# Patient Record
Sex: Male | Born: 1965 | Race: Black or African American | Hispanic: No | Smoking: Former smoker
Health system: Southern US, Community
[De-identification: ages and names within clinical notes are randomized; demographics above are authoritative.]

## PROBLEM LIST (undated history)

## (undated) DIAGNOSIS — I2510 Atherosclerotic heart disease of native coronary artery without angina pectoris: Secondary | ICD-10-CM

## (undated) DIAGNOSIS — M1990 Unspecified osteoarthritis, unspecified site: Secondary | ICD-10-CM

## (undated) DIAGNOSIS — R079 Chest pain, unspecified: Secondary | ICD-10-CM

## (undated) DIAGNOSIS — I10 Essential (primary) hypertension: Secondary | ICD-10-CM

## (undated) DIAGNOSIS — K760 Fatty (change of) liver, not elsewhere classified: Secondary | ICD-10-CM

## (undated) DIAGNOSIS — E785 Hyperlipidemia, unspecified: Secondary | ICD-10-CM

## (undated) HISTORY — PX: CLOSED REDUCTION SHOULDER DISLOCATION: SUR242

## (undated) HISTORY — DX: Hyperlipidemia, unspecified: E78.5

---

## 2013-05-31 ENCOUNTER — Ambulatory Visit (HOSPITAL_COMMUNITY): Admit: 2013-05-31 | Payer: Self-pay | Admitting: Interventional Cardiology

## 2013-05-31 ENCOUNTER — Encounter (HOSPITAL_COMMUNITY): Admission: EM | Disposition: A | Discharge status: Home / Self Care | Payer: Self-pay | Source: Home / Self Care

## 2013-05-31 ENCOUNTER — Encounter (HOSPITAL_COMMUNITY): Payer: Self-pay | Admitting: Cardiology

## 2013-05-31 ENCOUNTER — Observation Stay (HOSPITAL_COMMUNITY)
Admission: EM | Admit: 2013-05-31 | Discharge: 2013-06-02 | DRG: 287 | Disposition: A | Discharge status: Home / Self Care | Payer: BC Managed Care – PPO | Attending: Cardiovascular Disease | Admitting: Cardiovascular Disease

## 2013-05-31 DIAGNOSIS — I213 ST elevation (STEMI) myocardial infarction of unspecified site: Secondary | ICD-10-CM | POA: Insufficient documentation

## 2013-05-31 DIAGNOSIS — E781 Pure hyperglyceridemia: Secondary | ICD-10-CM | POA: Diagnosis present

## 2013-05-31 DIAGNOSIS — R9430 Abnormal result of cardiovascular function study, unspecified: Secondary | ICD-10-CM

## 2013-05-31 DIAGNOSIS — I10 Essential (primary) hypertension: Secondary | ICD-10-CM | POA: Diagnosis present

## 2013-05-31 DIAGNOSIS — R000 Tachycardia, unspecified: Secondary | ICD-10-CM | POA: Diagnosis present

## 2013-05-31 DIAGNOSIS — F1010 Alcohol abuse, uncomplicated: Secondary | ICD-10-CM | POA: Diagnosis present

## 2013-05-31 DIAGNOSIS — Z720 Tobacco use: Secondary | ICD-10-CM

## 2013-05-31 DIAGNOSIS — Z8249 Family history of ischemic heart disease and other diseases of the circulatory system: Secondary | ICD-10-CM | POA: Diagnosis not present

## 2013-05-31 DIAGNOSIS — F17200 Nicotine dependence, unspecified, uncomplicated: Secondary | ICD-10-CM | POA: Diagnosis present

## 2013-05-31 DIAGNOSIS — K759 Inflammatory liver disease, unspecified: Secondary | ICD-10-CM | POA: Diagnosis present

## 2013-05-31 DIAGNOSIS — R079 Chest pain, unspecified: Principal | ICD-10-CM | POA: Diagnosis present

## 2013-05-31 DIAGNOSIS — R7401 Elevation of levels of liver transaminase levels: Secondary | ICD-10-CM | POA: Diagnosis present

## 2013-05-31 DIAGNOSIS — K7689 Other specified diseases of liver: Secondary | ICD-10-CM | POA: Diagnosis present

## 2013-05-31 DIAGNOSIS — R740 Nonspecific elevation of levels of transaminase and lactic acid dehydrogenase [LDH]: Secondary | ICD-10-CM

## 2013-05-31 DIAGNOSIS — K760 Fatty (change of) liver, not elsewhere classified: Secondary | ICD-10-CM | POA: Diagnosis present

## 2013-05-31 HISTORY — DX: Chest pain, unspecified: R07.9

## 2013-05-31 HISTORY — PX: CARDIAC CATHETERIZATION: SHX172

## 2013-05-31 HISTORY — DX: Fatty (change of) liver, not elsewhere classified: K76.0

## 2013-05-31 HISTORY — PX: LEFT HEART CATHETERIZATION WITH CORONARY ANGIOGRAM: SHX5451

## 2013-05-31 HISTORY — DX: Unspecified osteoarthritis, unspecified site: M19.90

## 2013-05-31 HISTORY — DX: Essential (primary) hypertension: I10

## 2013-05-31 LAB — COMPREHENSIVE METABOLIC PANEL
ALT: 284 U/L — AB (ref 0–53)
AST: 652 U/L — AB (ref 0–37)
Albumin: 3.6 g/dL (ref 3.5–5.2)
Alkaline Phosphatase: 157 U/L — ABNORMAL HIGH (ref 39–117)
BILIRUBIN TOTAL: 0.8 mg/dL (ref 0.3–1.2)
BUN: 13 mg/dL (ref 6–23)
CO2: 21 meq/L (ref 19–32)
CREATININE: 0.49 mg/dL — AB (ref 0.50–1.35)
Calcium: 8.6 mg/dL (ref 8.4–10.5)
Chloride: 87 mEq/L — ABNORMAL LOW (ref 96–112)
GFR calc Af Amer: >90 mL/min (ref >90)
GFR calc non Af Amer: >90 mL/min (ref >90)
Glucose, Bld: 125 mg/dL — ABNORMAL HIGH (ref 70–99)
Potassium: 3.6 mEq/L — ABNORMAL LOW (ref 3.7–5.3)
Sodium: 128 mEq/L — ABNORMAL LOW (ref 137–147)
Total Protein: 7 g/dL (ref 6.0–8.3)

## 2013-05-31 LAB — POCT I-STAT, CHEM 8
BUN: 13 mg/dL (ref 6–23)
CREATININE: 0.8 mg/dL (ref 0.50–1.35)
Calcium, Ion: 1.15 mmol/L (ref 1.12–1.23)
Chloride: 96 mEq/L (ref 96–112)
GLUCOSE: 130 mg/dL — AB (ref 70–99)
HCT: 47 % (ref 39.0–52.0)
Hemoglobin: 16 g/dL (ref 13.0–17.0)
POTASSIUM: 3.2 meq/L — AB (ref 3.7–5.3)
SODIUM: 130 meq/L — AB (ref 137–147)
TCO2: 25 mmol/L (ref 0–100)

## 2013-05-31 LAB — CBC
HEMATOCRIT: 39.2 % (ref 39.0–52.0)
Hemoglobin: 14.5 g/dL (ref 13.0–17.0)
MCH: 33.4 pg (ref 26.0–34.0)
MCHC: 37 g/dL — ABNORMAL HIGH (ref 30.0–36.0)
MCV: 90.3 fL (ref 78.0–100.0)
Platelets: 189 10*3/uL (ref 150–400)
RBC: 4.34 MIL/uL (ref 4.22–5.81)
RDW: 14.4 % (ref 11.5–15.5)
WBC: 6.7 10*3/uL (ref 4.0–10.5)

## 2013-05-31 LAB — LIPID PANEL
Cholesterol: 510 mg/dL — ABNORMAL HIGH (ref 0–200)
LDL Cholesterol: UNDETERMINED mg/dL (ref 0–99)
Triglycerides: 3742 mg/dL — ABNORMAL HIGH (ref <150)
VLDL: UNDETERMINED mg/dL (ref 0–40)

## 2013-05-31 LAB — TROPONIN I
Troponin I: <0.3 ng/mL (ref <0.30)
Troponin I: <0.3 ng/mL (ref <0.30)

## 2013-05-31 LAB — PROTIME-INR
INR: 1.1 (ref 0.00–1.49)
PROTHROMBIN TIME: 14 s (ref 11.6–15.2)

## 2013-05-31 LAB — APTT: aPTT: 113 seconds — ABNORMAL HIGH (ref 24–37)

## 2013-05-31 LAB — HEMOGLOBIN A1C
Hgb A1c MFr Bld: 5.8 % — ABNORMAL HIGH (ref <5.7)
Mean Plasma Glucose: 120 mg/dL — ABNORMAL HIGH (ref <117)

## 2013-05-31 LAB — D-DIMER, QUANTITATIVE: D-Dimer, Quant: 0.81 ug/mL-FEU — ABNORMAL HIGH (ref 0.00–0.48)

## 2013-05-31 LAB — CK TOTAL AND CKMB (NOT AT ARMC)
CK, MB: 3.7 ng/mL (ref 0.3–4.0)
RELATIVE INDEX: 2.3 (ref 0.0–2.5)
Total CK: 160 U/L (ref 7–232)

## 2013-05-31 LAB — TSH: TSH: 3.363 u[IU]/mL (ref 0.350–4.500)

## 2013-05-31 LAB — GLUCOSE, CAPILLARY: Glucose-Capillary: 119 mg/dL — ABNORMAL HIGH (ref 70–99)

## 2013-05-31 SURGERY — LEFT HEART CATHETERIZATION WITH CORONARY ANGIOGRAM
Anesthesia: LOCAL

## 2013-05-31 MED ORDER — HEPARIN (PORCINE) IN NACL 2-0.9 UNIT/ML-% IJ SOLN
INTRAMUSCULAR | Status: AC
  Filled 2013-05-31: qty 1000

## 2013-05-31 MED ORDER — ACETAMINOPHEN 325 MG PO TABS: 650.0000 mg | ORAL_TABLET | ORAL | Status: DC | PRN

## 2013-05-31 MED ORDER — METOPROLOL TARTRATE 1 MG/ML IV SOLN
INTRAVENOUS | Status: AC
  Filled 2013-05-31: qty 5

## 2013-05-31 MED ORDER — HEPARIN SODIUM (PORCINE) 1000 UNIT/ML IJ SOLN
INTRAMUSCULAR | Status: AC
  Filled 2013-05-31: qty 1

## 2013-05-31 MED ORDER — MIDAZOLAM HCL 2 MG/2ML IJ SOLN
INTRAMUSCULAR | Status: AC
  Filled 2013-05-31: qty 2

## 2013-05-31 MED ORDER — NITROGLYCERIN 0.2 MG/ML ON CALL CATH LAB
INTRAVENOUS | Status: AC
  Filled 2013-05-31: qty 1

## 2013-05-31 MED ORDER — ALPRAZOLAM 0.25 MG PO TABS: 0.2500 mg | ORAL_TABLET | Freq: Three times a day (TID) | ORAL | Status: DC | PRN

## 2013-05-31 MED ORDER — SODIUM CHLORIDE 0.9 % IV SOLN: INTRAVENOUS | Status: AC

## 2013-05-31 MED ORDER — ASPIRIN 81 MG PO CHEW
81.0000 mg | CHEWABLE_TABLET | Freq: Every day | ORAL | Status: DC
  Administered 2013-06-01 – 2013-06-02 (×2): 81 mg via ORAL
  Filled 2013-05-31 (×2): qty 1

## 2013-05-31 MED ORDER — FENTANYL CITRATE 0.05 MG/ML IJ SOLN
INTRAMUSCULAR | Status: AC
  Filled 2013-05-31: qty 2

## 2013-05-31 MED ORDER — ONDANSETRON HCL 4 MG/2ML IJ SOLN: 4.0000 mg | Freq: Four times a day (QID) | INTRAMUSCULAR | Status: DC | PRN

## 2013-05-31 MED ORDER — ASPIRIN 81 MG PO CHEW: 81.0000 mg | CHEWABLE_TABLET | Freq: Every day | ORAL | Status: DC

## 2013-05-31 MED ORDER — LIDOCAINE HCL (PF) 1 % IJ SOLN
INTRAMUSCULAR | Status: AC
  Filled 2013-05-31: qty 30

## 2013-05-31 MED ORDER — VERAPAMIL HCL 2.5 MG/ML IV SOLN
INTRAVENOUS | Status: AC
  Filled 2013-05-31: qty 2

## 2013-05-31 MED ORDER — METOPROLOL TARTRATE 25 MG PO TABS
25.0000 mg | ORAL_TABLET | Freq: Two times a day (BID) | ORAL | Status: DC
  Administered 2013-05-31 – 2013-06-02 (×5): 25 mg via ORAL
  Filled 2013-05-31 (×6): qty 1

## 2013-05-31 MED ORDER — ZOLPIDEM TARTRATE 5 MG PO TABS: 5.0000 mg | ORAL_TABLET | Freq: Every evening | ORAL | Status: DC | PRN

## 2013-05-31 NOTE — CV Procedure (Signed)
   Cardiac Catheterization Procedure Note  Name: Dylan Summers MRN: [id number] DOB: October 03, 1965  Procedure: Left Heart Cath, Selective Coronary Angiography, LV angiography aortic root angiography,   Indication: Acute dyspnea with palpitations. ECG showed 2 mm of ST elevation in V2 and V3 with Q waves. This was an urgent cardiac catheterization for suspected STEMI.   Medications:  Sedation:  2 mg IV Versed, 50  mcg IV Fentanyl  Contrast:  110 mL  Omnipaque   Procedural Details: The right wrist was prepped, draped, and anesthetized with 1% lidocaine. Using the modified Seldinger technique, a 5 French sheath was introduced into the right radial artery. 3 mg of verapamil was administered through the sheath, weight-based unfractionated heparin was administered intravenously. standard Judkins  catheters was used for selective coronary angiography. A pigtail catheter was used for left ventriculography. Catheter exchanges were performed over an exchange length guidewire. There were no immediate procedural complications. A TR band was used for radial hemostasis at the completion of the procedure.  The patient was transferred to the post catheterization recovery area for further monitoring.  Procedural Findings:  Hemodynamics: AO:  130/97   mmHg LV:  134/9    mmHg LVEDP:  10   mmHg  Coronary angiography: Coronary dominance: Right   the coronary arteries are overall tortuous suggestive of hypertensive changes.    Left Main:  Short and normal.   Left Anterior Descending (LAD):  Normal in size with no significant disease.   1st diagonal (D1):  Medium in size and normal   2nd diagonal (D2):  Normal   3rd diagonal (D3):  Small in size with no significant disease   Circumflex (LCx):  Large in diameter and nondominant. The vessel has no significant disease.   1st obtuse marginal:  Small in size with no significant disease.   2nd obtuse marginal:  Normal in size with no significant  disease.   3rd obtuse marginal:  Normal in size with no significant disease.    Right Coronary Artery: Normal in size and dominant. The vessel has minor irregularities with no significant disease.   Posterior descending artery: Normal in size with no significant disease.   Left ventriculography: Left ventricular systolic function is normal , LVEF is estimated at 55-60 %, there is no  significant mitral regurgitation   Aortic root angiography was performed in the LAO position: This showed normal diameter of the ascending aorta, aortic arch and the proximal portion of the descending aorta. No evidence of aneurysm or dissection  Final Conclusions:   1. Normal coronary arteries   2. Normal LV systolic function and left ventricular end-diastolic pressure. 3. No evidence of thoracic aortic aneurysm or dissection.  Recommendations:  The exact etiology of the patient's symptoms is still not entirely clear. Observe overnight. Check d-dimer. If elevated evaluate for pulmonary embolism. Obtain an echocardiogram to evaluate for hypertensive heart disease. Possible discharge in the morning.   [name] MD, [facility] 05/31/2013, 12:18 PM

## 2013-05-31 NOTE — Progress Notes (Signed)
Utilization Review Completed Shamarr Faucett J. Wheeler [employer], RN, BSN, NCM [phone]  

## 2013-05-31 NOTE — Progress Notes (Signed)
TR BAND REMOVAL  LOCATION:    right radial  DEFLATED PER PROTOCOL:    yes  TIME BAND OFF / DRESSING APPLIED:    1415   SITE UPON ARRIVAL:    Level 0  SITE AFTER BAND REMOVAL:    Level 0  REVERSE ALLEN'S TEST:     positive  CIRCULATION SENSATION AND MOVEMENT:    Within Normal Limits   yes  COMMENTS:   TOLERATED PROCEDURE WELL

## 2013-05-31 NOTE — H&P (Signed)
Patient ID: Dylan Summers MRN: [id number], DOB/AGE: 1965/11/21   Admit date: 05/31/2013   Primary Physician: No primary provider on file. Primary Cardiologist: Dr Kirke Corin  HPI: 48 y/o with a history of HTN admitted via EMS with STEMI with chest discomfort, palpitations, and ST elevation in V2 and V3.  Pt stable on arrival to ER, taken directly to the cath lab after being evaluated by Dr Kirke Corin.    Problem List: Past Medical History  Diagnosis Date  . HTN (hypertension)     No past surgical history on file.   Allergies: Allergies not on file   Home Medications No current facility-administered medications for this encounter.     Family History  Problem Relation Age of Onset  . Coronary artery disease Mother     CABG     History   Social History  . Marital Status: Married    Spouse Name: N/A    Number of Children: N/A  . Years of Education: N/A   Occupational History  . Not on file.   Social History Main Topics  . Smoking status: Light Tobacco Smoker    Types: Cigars  . Smokeless tobacco: Not on file  . Alcohol Use: Not on file  . Drug Use: Not on file  . Sexual Activity: Not on file   Other Topics Concern  . Not on file   Social History Narrative   Married, one child, works at [employer]     Review of Systems: General: negative for chills, fever, night sweats or weight changes.  Cardiovascular: negative for chest pain, dyspnea on exertion, edema, orthopnea, palpitations, paroxysmal nocturnal dyspnea or shortness of breath Dermatological: negative for rash Respiratory: negative for cough or wheezing Urologic: negative for hematuria Abdominal: negative for nausea, vomiting, diarrhea, bright red blood per rectum, melena, or hematemesis Neurologic: negative for visual changes, syncope, or dizziness All other systems reviewed and are otherwise negative except as noted above.  Physical Exam: There were no vitals taken for this visit.  General appearance:  alert, cooperative and no distress Neck: no carotid bruit and no JVD Lungs: clear to auscultation bilaterally Heart: regular rate and rhythm and tachycardic Abdomen: not distended, non tender Extremities: no edema Pulses: 2+ and symmetric Skin: Skin color, texture, turgor normal. No rashes or lesions Neurologic: Grossly normal    Labs:  No results found for this or any previous visit (from the past 24 hour(s)).   Radiology/Studies: No results found.  EKG: NSR, ST J point elevation V2 and V3  ASSESSMENT AND PLAN:  Principal Problem:   STEMI (ST elevation myocardial infarction) 05/31/13 Active Problems:   HTN (hypertension)   Family history of coronary artery bypass surgery   PLAN: Urgent cath.    Deland Pretty, PA-C 05/31/2013, 11:43 AM

## 2013-05-31 NOTE — H&P (Signed)
   I have seen and examined the patient. I agree with the above note with the addition of : 48 year old male with PMH of hypertension and tobacco use. He presented with dyspnea, nausea and palpitations which started around 5:30 am. The wife reports that he recently complained of recent fatigue and not feeling well. There was a possible syncopal episode about 2 months ago while in the mall with his daughters. He did not seek medical attention.  ECG on presentation showed septal Q waves with 2 mm STE in V2 and V3. Previous ECG showed minimal anterior STE.  I proceeded with urgent cardiac cath which showed no significant CAD. Normal EF and no evidence of aortic dissection.  Recommend overnight observation . Check D-dimer and echocardiogram. Check telemetry given reported palpitation and questionable syncopal episode.   Possible discharge tomorrow if negative work up.   [name] MD, [facility] 05/31/2013 1:33 PM

## 2013-06-01 ENCOUNTER — Inpatient Hospital Stay (HOSPITAL_COMMUNITY): Payer: BC Managed Care – PPO

## 2013-06-01 ENCOUNTER — Encounter (HOSPITAL_COMMUNITY): Payer: Self-pay | Admitting: Nurse Practitioner

## 2013-06-01 DIAGNOSIS — I517 Cardiomegaly: Secondary | ICD-10-CM

## 2013-06-01 DIAGNOSIS — R7401 Elevation of levels of liver transaminase levels: Secondary | ICD-10-CM | POA: Diagnosis present

## 2013-06-01 DIAGNOSIS — Z720 Tobacco use: Secondary | ICD-10-CM | POA: Diagnosis present

## 2013-06-01 DIAGNOSIS — R740 Nonspecific elevation of levels of transaminase and lactic acid dehydrogenase [LDH]: Secondary | ICD-10-CM

## 2013-06-01 LAB — BASIC METABOLIC PANEL
BUN: 7 mg/dL (ref 6–23)
CHLORIDE: 86 meq/L — AB (ref 96–112)
CO2: 21 mEq/L (ref 19–32)
Calcium: 9.1 mg/dL (ref 8.4–10.5)
Creatinine, Ser: 0.59 mg/dL (ref 0.50–1.35)
GFR calc Af Amer: >90 mL/min (ref >90)
GFR calc non Af Amer: >90 mL/min (ref >90)
GLUCOSE: 99 mg/dL (ref 70–99)
POTASSIUM: 5.4 meq/L — AB (ref 3.7–5.3)
Sodium: 126 mEq/L — ABNORMAL LOW (ref 137–147)

## 2013-06-01 LAB — TROPONIN I: Troponin I: <0.3 ng/mL (ref <0.30)

## 2013-06-01 MED ORDER — POTASSIUM CHLORIDE ER 20 MEQ PO TBCR: 20.0000 meq | EXTENDED_RELEASE_TABLET | Freq: Every day | ORAL | Status: DC

## 2013-06-01 MED ORDER — HYDRALAZINE HCL 20 MG/ML IJ SOLN: 10.0000 mg | Freq: Once | INTRAMUSCULAR | Status: DC

## 2013-06-01 MED ORDER — HYDRALAZINE HCL 10 MG PO TABS
10.0000 mg | ORAL_TABLET | Freq: Once | ORAL | Status: AC
  Administered 2013-06-01: 10 mg via ORAL
  Filled 2013-06-01: qty 1

## 2013-06-01 MED ORDER — IOHEXOL 350 MG/ML SOLN
100.0000 mL | Freq: Once | INTRAVENOUS | Status: AC | PRN
  Administered 2013-06-01: 100 mL via INTRAVENOUS

## 2013-06-01 NOTE — Progress Notes (Addendum)
    Subjective:  No chest pain or dyspnea. Still with some palpitations.  Objective:  Vital Signs in the last 24 hours: Temp:  [97.9 F (36.6 C)-98.7 F (37.1 C)] 97.9 F (36.6 C) (02/05 0741) Pulse Rate:  [74-112] 78 (02/05 0741) Resp:  [16-20] 20 (02/05 0741) BP: (143-168)/(91-107) 158/95 mmHg (02/05 0741) SpO2:  [94 %-99 %] 94 % (02/05 0741) Weight:  [171 lb 4.8 oz (77.7 kg)] 171 lb 4.8 oz (77.7 kg) (02/05 0019)  Intake/Output from previous day: 02/04 0701 - 02/05 0700 In: 1071.7 [P.O.:480; I.V.:591.7] Out: 700 [Urine:700]  Physical Exam: Pt is alert and oriented, NAD HEENT: normal Neck: JVP - normal, carotids 2+= without bruits Lungs: CTA bilaterally CV: RRR without murmur or gallop Abd: soft, NT, Positive BS, no hepatomegaly Ext: no C/C/E, distal pulses intact and equal Skin: warm/dry no rash   Lab Results:  Recent Labs  05/31/13 1150 05/31/13 1153  WBC 6.7  --   HGB 14.5 16.0  PLT 189  --     Recent Labs  05/31/13 1150 05/31/13 1153  NA 128* 130*  K 3.6* 3.2*  CL 87* 96  CO2 21  --   GLUCOSE 125* 130*  BUN 13 13  CREATININE 0.49* 0.80    Recent Labs  05/31/13 1955 06/01/13 0109  TROPONINI <0.30 <0.30    Cardiac Studies: Cath reviewed.  Echo: Study Conclusions  Left ventricle: The cavity size was normal. There was mild focal basal hypertrophy of the septum. Systolic function was normal. The estimated ejection fraction was in the range of 55% to 60%. Wall motion was normal; there were no regional wall motion abnormalities. Doppler parameters are consistent with abnormal left ventricular relaxation (grade 1 diastolic dysfunction).   Tele: Sinus rhythm, sinus tach up to 150 bpm  Assessment/Plan:  1. Chest pain - cath unremarkable, normal LV function, no pericardial effusion.  2. Tachycardia, unexplained. Elevated D-dimer - recommend Chest CTA to eval for pulmonary embolus.  Dispo pending CTA result. If negative I think we have  ruled out all serious causes of chest pain/tachycardia. Data reviewed including labs, cath, and echo.   Tonny Bollman, M.D. 06/01/2013, 9:58 AM   LFT's reviewed. Transaminases markedly elevated. Pt without abdominal pain or GI symptoms. Recommend keep overnight, check abdominal ultrasound, repeat LFT's and lipid panel in the am. Will ask GI to see after initial evaluation done. Explained plan to patient and wife who understand and agree with plan. He may have alcoholic hepatitis as he admits to heavy Etoh.  Tonny Bollman 06/01/2013 3:41 PM

## 2013-06-01 NOTE — Discharge Summary (Signed)
Discharge Summary   Patient ID: Dylan Summers,  MRN: [id number], DOB/AGE: Apr 22, 1966 48 y.o.  Admit date: 05/31/2013 Discharge date: 06/02/2013  [facility] Primary Cardiologist: Judie PetitM. Kirke CorinArida, MD   Discharge Diagnoses Principal Problem:   Chest pain  **Status post catheterization this admission revealing normal coronary arteries.  **Status post CT angiography of the chest with contrast revealing no evidence of pulmonary embolism.  Active Problems:   HTN (hypertension)   Tobacco abuse Steatosis, liver  Allergies No Known Allergies  Procedures  Cardiac Catheterization 2.4.2015  Hemodynamics: AO:  130/97   mmHg LV:  134/9    mmHg LVEDP:           10   mmHg  Coronary angiography: Coronary dominance: Right   the coronary arteries are overall tortuous suggestive of hypertensive changes.       Left Main:  Short and normal.    Left Anterior Descending (LAD):  Normal in size with no significant disease.    1st diagonal (D1):  Medium in size and normal    2nd diagonal (D2):  Normal    3rd diagonal (D3):  Small in size with no significant disease    Circumflex (LCx):  Large in diameter and nondominant. The vessel has no significant disease.    1st obtuse marginal:  Small in size with no significant disease.    2nd obtuse marginal:  Normal in size with no significant disease.    3rd obtuse marginal:  Normal in size with no significant disease.             Right Coronary Artery: Normal in size and dominant. The vessel has minor irregularities with no significant disease.    Posterior descending artery: Normal in size with no significant disease.  Left ventriculography: Left ventricular systolic function is normal , LVEF is estimated at 55-60 %, there is no  significant mitral regurgitation   Aortic root angiography was performed in the LAO position: This showed normal diameter of the ascending aorta, aortic arch and the proximal portion of the  descending aorta. No evidence of aneurysm or dissection  Final Conclusions:   1. Normal coronary arteries    2. Normal LV systolic function and left ventricular end-diastolic pressure. 3. No evidence of thoracic aortic aneurysm or dissection. _____________   2D Echocardiogram 2.5.2015  Study Conclusions  Left ventricle: The cavity size was normal. There was mild focal basal hypertrophy of the septum. Systolic function was normal. The estimated ejection fraction was in the range of 55% to 60%. Wall motion was normal; there were no regional wall motion abnormalities. Doppler parameters are consistent with abnormal left ventricular relaxation (grade 1 diastolic dysfunction).   _____________   CT Angio of the Chest with Contrast  2.5.2015  IMPRESSION: No pulmonary embolus. No acute abnormality identified within the chest. _____________   ABD ultrosound 06/01/12 Fatty infiltration liver. The study is otherwise negative    History of Present Illness  48 year old male with prior history of hypertension, tobacco abuse, and alcohol use. He has no prior history of coronary artery disease. He was in his usual state of health until the morning of admission when he developed substernal chest discomfort associated with palpitations. ECG showed very slight ST segment elevation in lead V2 and V3 and a code STEMI was activated. The patient was taken to the cardiac catheterization laboratory for emergent catheterization.  Hospital Course  Diagnostic catheterization was performed and showed normal coronary arteries. He also had normal LV function. He  was transferred to the step down area where he subsequently ruled out for myocardial infarction and had no further chest pain. His d-dimer was mildly elevated at 0.81 and a CT angiography of the chest with contrast was performed and did not show any evidence of pulmonary embolism. 2-D echocardiography was also performed and showed normal LV function  with grade 1 diastolic dysfunction and without any significant valvular abnormalities. Patient has been ambulating without recurrent symptoms or limitations. Of note, he has been found to have elevated transaminases as well as a total cholesterol 510 and triglycerides of 3742. His lipid profile was not fasting however.  Discharge was thus delayed and follow up TG 704 T CHOL 377.   Abd ultrasound was done revealing fatty live.  GI consult was obtained with Dr. Dulce Sellar who felt Hepatocellular-predominant. 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 However, given his elevated lipid profile, could have component of non-alcohol steatohepatitis (NASH) as well.  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  They will follow post hospital.  We did add Lovaza to medications.  He will follow up with Dr. Kirke Corin and arrange for repeat Lipids or lipid clinic.    We will check amylase or lipase prior to discharge for possible pancreatitis.  They were mildly elevated and I reviewed with Dr. Mayford Knife and Dr. Dulce Sellar.  No change in treatment plan and no abdominal pain.   Pt was seen and evaluated by Dr. Mayford Knife and found stable for discharge.  His labs had improved prior to discharge.  For his HTN we increased his BB.  He will follow up as instructed.  He will need lipids and hepatic in 6 weeks though GI may eval. Prior to that time.   Discharge Vitals Blood pressure 153/102, pulse 88, temperature 98.4 F (36.9 C), temperature source Oral, resp. rate 18, height 5\' 7"  (1.702 m), weight 172 lb 11.3 oz (78.34 kg), SpO2 99.00%.  Filed Weights   06/01/13 0019 06/01/13 1742  Weight: 171 lb 4.8 oz (77.7 kg) 172 lb 11.3 oz (78.34 kg)    Labs   CBC  Recent Labs  05/31/13 1150 05/31/13 1153  WBC 6.7  --   HGB 14.5 16.0  HCT 39.2 47.0  MCV 90.3  --   PLT 189  --    Basic Metabolic Panel  Recent  Labs  71/21/97 06/01/13 1330  NA 130* 126*  K 3.9 5.4*  CL 87* 86*  CO2 26 21  GLUCOSE 108* 99  BUN 8 7  CREATININE 0.61 0.59  CALCIUM 9.9 9.1   Liver Function Tests  Recent Labs  06/01/13 06/02/13 1030  AST 469* 295*  ALT 364* 289*  ALKPHOS 145* 128*  BILITOT 2.0* 1.6*  PROT 8.7* 7.6  ALBUMIN 4.3 3.7   Cardiac Enzymes  Recent Labs  05/31/13 1150 05/31/13 1455 05/31/13 1955 06/01/13 0109  CKTOTAL 160  --   --   --   CKMB 3.7  --   --   --   TROPONINI <0.30 <0.30 <0.30 <0.30   D-Dimer  Recent Labs  05/31/13 1455  DDIMER 0.81*   Hemoglobin A1C  Recent Labs  05/31/13 1150  HGBA1C 5.8*   Fasting Lipid Panel  Recent Labs  06/01/13 2345  CHOL 377*  HDL 38*  LDLCALC UNABLE TO CALCULATE IF TRIGLYCERIDE OVER 400 mg/dL  TRIG 588*  CHOLHDL 9.9   Thyroid Function Tests  Recent  Labs  05/31/13 1455  TSH 3.363     Amylase 143 Lipase 64   Disposition  Pt is being discharged home today in good condition.  Follow-up Plans & Appointments      Follow-up Information   Follow up with [facility], MD. (1-2 wks)    Specialty:  [facility]   Contact information:   [address] [place] [address] [phone]       Follow up with Freddy Jaksch, MD On 06/23/2013. (at 3:00 PM)    Specialty:  Gastroenterology   Contact information:   [address], [address] [place] [address] [phone]       Follow up with [name], MD On 06/20/2013. (at 8:45 AM  for follow up -lipids)    Specialty:  Cardiology   Contact information:   [facility]. [address] [place] [address] [phone]      Discharge Medications    Medication List         lisinopril-hydrochlorothiazide 20-25 MG per tablet  Commonly known as:  PRINZIDE,ZESTORETIC  Take 1 tablet by mouth daily.     metoprolol 50 MG tablet  Commonly known as:  LOPRESSOR  Take 1 tablet (50 mg total) by mouth 2 (two) times daily.      omega-3 acid ethyl esters 1 G capsule  Commonly known as:  LOVAZA  Take 1 capsule (1 g total) by mouth 2 (two) times daily.     Potassium Chloride ER 20 MEQ Tbcr  Take 20 mEq by mouth daily.       Outstanding Labs/Studies  None  Duration of Discharge Encounter   Greater than 30 minutes including physician time.  Signed, Nada Boozer R NP 06/02/2013, 3:13 PM

## 2013-06-01 NOTE — Progress Notes (Signed)
Assessed for IV start, unable to find one.  Will page IV nurse. Dylan Summers

## 2013-06-01 NOTE — Progress Notes (Signed)
MD returned paged, ordered 10mg  of hydralazine. Dylan Summers

## 2013-06-01 NOTE — Progress Notes (Signed)
Pt. BP is 157/115.  MD paged.  Awaiting call back.  Will continue to monitor. Vanice Sarah

## 2013-06-01 NOTE — Progress Notes (Signed)
Notified pharmacy for hydralazine Vanice Sarah

## 2013-06-01 NOTE — Progress Notes (Signed)
  Echocardiogram 2D Echocardiogram has been performed.  Cathie Beams 06/01/2013, 8:49 AM

## 2013-06-02 ENCOUNTER — Encounter (HOSPITAL_COMMUNITY): Payer: Self-pay | Admitting: Cardiology

## 2013-06-02 DIAGNOSIS — K760 Fatty (change of) liver, not elsewhere classified: Secondary | ICD-10-CM

## 2013-06-02 DIAGNOSIS — R740 Nonspecific elevation of levels of transaminase and lactic acid dehydrogenase [LDH]: Secondary | ICD-10-CM

## 2013-06-02 DIAGNOSIS — I219 Acute myocardial infarction, unspecified: Secondary | ICD-10-CM

## 2013-06-02 DIAGNOSIS — F17200 Nicotine dependence, unspecified, uncomplicated: Secondary | ICD-10-CM

## 2013-06-02 DIAGNOSIS — R7402 Elevation of levels of lactic acid dehydrogenase (LDH): Secondary | ICD-10-CM

## 2013-06-02 DIAGNOSIS — Z8249 Family history of ischemic heart disease and other diseases of the circulatory system: Secondary | ICD-10-CM

## 2013-06-02 HISTORY — DX: Fatty (change of) liver, not elsewhere classified: K76.0

## 2013-06-02 LAB — COMPREHENSIVE METABOLIC PANEL
ALBUMIN: 4.3 g/dL (ref 3.5–5.2)
ALT: 364 U/L — AB (ref 0–53)
AST: 469 U/L — AB (ref 0–37)
Alkaline Phosphatase: 145 U/L — ABNORMAL HIGH (ref 39–117)
BUN: 8 mg/dL (ref 6–23)
CALCIUM: 9.9 mg/dL (ref 8.4–10.5)
CO2: 26 mEq/L (ref 19–32)
Chloride: 87 mEq/L — ABNORMAL LOW (ref 96–112)
Creatinine, Ser: 0.61 mg/dL (ref 0.50–1.35)
GFR calc non Af Amer: >90 mL/min (ref >90)
Glucose, Bld: 108 mg/dL — ABNORMAL HIGH (ref 70–99)
Potassium: 3.9 mEq/L (ref 3.7–5.3)
SODIUM: 130 meq/L — AB (ref 137–147)
TOTAL PROTEIN: 8.7 g/dL — AB (ref 6.0–8.3)
Total Bilirubin: 2 mg/dL — ABNORMAL HIGH (ref 0.3–1.2)

## 2013-06-02 LAB — HEPATIC FUNCTION PANEL
ALT: 289 U/L — AB (ref 0–53)
AST: 295 U/L — AB (ref 0–37)
Albumin: 3.7 g/dL (ref 3.5–5.2)
Alkaline Phosphatase: 128 U/L — ABNORMAL HIGH (ref 39–117)
BILIRUBIN DIRECT: 0.6 mg/dL — AB (ref 0.0–0.3)
BILIRUBIN TOTAL: 1.6 mg/dL — AB (ref 0.3–1.2)
Indirect Bilirubin: 1 mg/dL — ABNORMAL HIGH (ref 0.3–0.9)
Total Protein: 7.6 g/dL (ref 6.0–8.3)

## 2013-06-02 LAB — AMYLASE: Amylase: 143 U/L — ABNORMAL HIGH (ref 0–105)

## 2013-06-02 LAB — LIPID PANEL
CHOL/HDL RATIO: 9.9 ratio
Cholesterol: 377 mg/dL — ABNORMAL HIGH (ref 0–200)
HDL: 38 mg/dL — AB (ref >39)
LDL CALC: UNDETERMINED mg/dL (ref 0–99)
Triglycerides: 704 mg/dL — ABNORMAL HIGH (ref <150)
VLDL: UNDETERMINED mg/dL (ref 0–40)

## 2013-06-02 LAB — LIPASE, BLOOD: Lipase: 64 U/L — ABNORMAL HIGH (ref 11–59)

## 2013-06-02 MED ORDER — OMEGA-3-ACID ETHYL ESTERS 1 G PO CAPS: 1.0000 g | ORAL_CAPSULE | Freq: Two times a day (BID) | ORAL | Status: DC

## 2013-06-02 MED ORDER — OMEGA-3-ACID ETHYL ESTERS 1 G PO CAPS
1.0000 g | ORAL_CAPSULE | Freq: Two times a day (BID) | ORAL | Status: DC
  Administered 2013-06-02: 1 g via ORAL
  Filled 2013-06-02: qty 1

## 2013-06-02 MED ORDER — METOPROLOL TARTRATE 50 MG PO TABS: 50.0000 mg | ORAL_TABLET | Freq: Two times a day (BID) | ORAL | Status: DC

## 2013-06-02 NOTE — Consult Note (Signed)
[facility] Consultation Note  Referring Provider:  Dr. [name] (Cardiology) Primary Care Physician:  Almedia Balls, MD  Reason for Consultation:  Elevated LFTs  HPI: Dylan Summers is a 48 y.o. male admitted for chest pain.   We were asked to see for evaluation of elevated LFTs.  Patient drinks 2-3 alcoholic beverages near-daily, sometimes more sometimes less.  No prior history of liver troubles.  LFTs as below.  No family history of liver disease.  He has no nausea, vomiting, dysphagia, hematemesis, melena, hematochezia, abdominal pain, change in bowel habits.  Is losing some weight, intentionally, through dietary modification and exercise.  No fevers, jaundice, pruritus.  No recent new medications.  Had abdominal ultrasound this admission showing steatosis, no biliary dilatation, no focal liver mass.   Past Medical History  Diagnosis Date  . HTN (hypertension)   . Arthritis     "left groin & right foot pains only when weather gets cold" (05/31/2013)  . Chest pain     a. 05/2013 Ant STEMI called but cath showed nl cors and EF 55-60%, Trop neg, D-dimer elevated->CTA    Past Surgical History  Procedure Laterality Date  . Cardiac catheterization  05/31/2013    Prior to Admission medications   Medication Sig Start Date End Date Taking? Authorizing Provider  lisinopril-hydrochlorothiazide (PRINZIDE,ZESTORETIC) 20-25 MG per tablet Take 1 tablet by mouth daily.   Yes Historical Provider, MD  potassium chloride 20 MEQ TBCR Take 20 mEq by mouth daily. 06/01/13   Ok Anis, NP    Current Facility-Administered Medications  Medication Dose Route Frequency Provider Last Rate Last Dose  . acetaminophen (TYLENOL) tablet 650 mg  650 mg Oral Q4H PRN Abelino Derrick, PA-C      . ALPRAZolam Prudy Feeler) tablet 0.25 mg  0.25 mg Oral TID PRN Abelino Derrick, PA-C      . aspirin chewable tablet 81 mg  81 mg Oral Daily Abelino Derrick, PA-C   81 mg at 06/02/13 0981  . metoprolol (LOPRESSOR)  tablet 50 mg  50 mg Oral BID Quintella Reichert, MD      . zolpidem (AMBIEN) tablet 5 mg  5 mg Oral QHS PRN Abelino Derrick, PA-C        Allergies as of 05/31/2013  . (No Known Allergies)    Family History  Problem Relation Age of Onset  . Coronary artery disease Mother     CABG    History   Social History  . Marital Status: Married    Spouse Name: N/A    Number of Children: N/A  . Years of Education: N/A   Occupational History  . Not on file.   Social History Main Topics  . Smoking status: Light Tobacco Smoker    Types: Cigars  . Smokeless tobacco: Never Used  . Alcohol Use: No  . Drug Use: No  . Sexual Activity: Yes     Comment: 05/31/2013 "might smoke a Black N Mild once/wk"   Other Topics Concern  . Not on file   Social History Narrative   Married, one child, works at [employer]    Review of Systems: ROS Orthoptist (PA-C for Dr. Kirke Corin) reviewed and I agree  Physical Exam: Vital signs in last 24 hours: Temp:  [97.8 F (36.6 C)-98.7 F (37.1 C)] 98.4 F (36.9 C) (02/06 0513) Pulse Rate:  [83-96] 96 (02/06 0933) Resp:  [18] 18 (02/06 0513) BP: (141-157)/(88-115) 148/88 mmHg (02/06 0933) SpO2:  [98 %-99 %]  99 % (02/06 0513) Weight:  [78.34 kg (172 lb 11.3 oz)] 78.34 kg (172 lb 11.3 oz) (02/05 1742) Last BM Date: 06/01/13 General:   Alert,  Well-developed, well-nourished, pleasant and cooperative in NAD Head:  Normocephalic and atraumatic. Eyes:  Sclera clear, no icterus.   Conjunctiva pink. Ears:  Normal auditory acuity. Nose:  No deformity, discharge,  or lesions. Mouth:  No deformity or lesions.  Oropharynx pink & moist. Neck:  Supple; no masses or thyromegaly. Lungs:  Clear throughout to auscultation.   No wheezes, crackles, or rhonchi. No acute distress. Heart:  Regular rate and rhythm; no murmurs, clicks, rubs,  or gallops. Abdomen:  Soft, nontender and nondistended. No masses, hepatosplenomegaly or hernias noted. Normal bowel sounds, without guarding, and  without rebound.     Msk:  Symmetrical without gross deformities. Normal posture. Pulses:  Normal pulses noted. Extremities:  Without clubbing or edema. Neurologic:  Alert and  oriented x4;  grossly normal neurologically. Skin:  Intact without significant lesions or rashes. Cervical Nodes:  No significant cervical adenopathy. Psych:  Alert and cooperative. Normal mood and affect.   Lab Results:  Recent Labs  05/31/13 1150 05/31/13 1153  WBC 6.7  --   HGB 14.5 16.0  HCT 39.2 47.0  PLT 189  --    BMET  Recent Labs  05/31/13 1150 05/31/13 1153 06/01/13 06/01/13 1330  NA 128* 130* 130* 126*  K 3.6* 3.2* 3.9 5.4*  CL 87* 96 87* 86*  CO2 21  --  26 21  GLUCOSE 125* 130* 108* 99  BUN 13 13 8 7   CREATININE 0.49* 0.80 0.61 0.59  CALCIUM 8.6  --  9.9 9.1   LFT  Recent Labs  06/02/13 1030  PROT 7.6  ALBUMIN 3.7  AST 295*  ALT 289*  ALKPHOS 128*  BILITOT 1.6*  BILIDIR 0.6*  IBILI 1.0*   PT/INR  Recent Labs  05/31/13 1150  LABPROT 14.0  INR 1.10    Studies/Results: Ct Angio Chest Pe W/cm &/or Wo Cm  06/01/2013   CLINICAL DATA:  Chest pain, tachycardia  EXAM: CT ANGIOGRAPHY CHEST WITH CONTRAST  TECHNIQUE: Multidetector CT imaging of the chest was performed using the standard protocol during bolus administration of intravenous contrast. Multiplanar CT image reconstructions including MIPs were obtained to evaluate the vascular anatomy.  CONTRAST:  OMNIPAQUE IOHEXOL 350 MG/ML SOLN  COMPARISON:  None.  FINDINGS: There is no pulmonary embolus. The aorta is normal. There is no mediastinal or hilar lymphadenopathy. The heart size is normal. There is no pericardial effusion. Images of the lungs demonstrate minor bilateral apical scar. There is no pulmonary infiltrate or pleural effusion. Images of the visualized upper abdominal structures demonstrate diffuse fatty infiltration of the liver. The other visualized upper abdominal structures are unremarkable. No acute  abnormalities identified within the visualized bones.  Review of the MIP images confirms the above findings.  IMPRESSION: No pulmonary embolus. No acute abnormality identified within the chest.   Electronically Signed   By: Sherian Rein M.D.   On: 06/01/2013 11:38   US Abdomen Complete  06/01/2013   CLINICAL DATA:  Abnormal liver function tests.  EXAM: ULTRASOUND ABDOMEN COMPLETE  COMPARISON:  None.  FINDINGS: Gallbladder:  No gallstones or wall thickening visualized. No sonographic Murphy sign noted.  Common bile duct:  Diameter: 0.4 cm.  Liver:  No focal liver lesion or intrahepatic biliary ductal dilatation is identified. Increased echogenicity is compatible fatty infiltration.  IVC:  No abnormality visualized.  Pancreas:  Visualized portion unremarkable.  Spleen:  Size and appearance within normal limits.  Right Kidney:  Length: 11.0 cm. Echogenicity within normal limits. No mass or hydronephrosis visualized.  Left Kidney:  Length: 11.9 cm. Echogenicity within normal limits. No hydronephrosis or stone. 1.2 cm simple lower pole cyst noted.  Abdominal aorta:  No aneurysm visualized.  Other findings:  None.  IMPRESSION: Fatty infiltration liver.  The study is otherwise negative   Electronically Signed   By: Drusilla Kannerhomas  Dalessio M.D.   On: 06/01/2013 23:38   Impression:  1.  Elevated LFTs.  Hepatocellular-predominant.  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  However, given his elevated lipid profile, could have component of non-alcohol steatohepatitis (NASH) as well. 2.  Abnormal imaging study of liver, steatosis seen.  Differential considerations as above.  Plan:  1.  Strongly counseled patient to abstain completely from alcohol use. 2.  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 3.  OK to start whatever antilipid therapy is felt  indicated. 4.  [facility] [phone]) in 3-4 weeks; we will recheck LFTs at that time, and, if LFTs are still elevated, will initiate further abnormal LFT serologic work-up (iron profile, Hep A/B/C, autoimmune serologies). 5.  Will sign-off; please call with questions; thank you for the consult.   LOS: 2 days   Taelar Gronewold M  06/02/2013, 12:05 PM

## 2013-06-02 NOTE — Discharge Instructions (Signed)
**  PLEASE REMEMBER TO BRING ALL OF YOUR MEDICATIONS TO EACH OF YOUR FOLLOW-UP OFFICE VISITS.  Radial Site Care Refer to this sheet in the next few weeks. These instructions provide you with information on caring for yourself after your procedure. Your caregiver may also give you more specific instructions. Your treatment has been planned according to current medical practices, but problems sometimes occur. Call your caregiver if you have any problems or questions after your procedure. HOME CARE INSTRUCTIONS  You may shower the day after the procedure.Remove the bandage (dressing) and gently wash the site with plain soap and water.Gently pat the site dry.   Do not apply powder or lotion to the site.   Do not submerge the affected site in water for 3 to 5 days.   Inspect the site at least twice daily.   Do not flex or bend the affected arm for 24 hours.   No lifting over 5 pounds (2.3 kg) for 5 days after your procedure.   Do not drive home if you are discharged the same day of the procedure. Have someone else drive you.   You may drive 24 hours after the procedure unless otherwise instructed by your caregiver.  What to expect:  Any bruising will usually fade within 1 to 2 weeks.   Blood that collects in the tissue (hematoma) may be painful to the touch. It should usually decrease in size and tenderness within 1 to 2 weeks.  SEEK IMMEDIATE MEDICAL CARE IF:  You have unusual pain at the radial site.   You have redness, warmth, swelling, or pain at the radial site.   You have drainage (other than a small amount of blood on the dressing).   You have chills.   You have a fever or persistent symptoms for more than 72 hours.   You have a fever and your symptoms suddenly get worse.   Your arm becomes pale, cool, tingly, or numb.   You have heavy bleeding from the site. Hold pressure on the site.   Stopping alcohol would improve your liver.  Decrease fatty foods and sweet foods.   Decrease and stop tobacco.    Follow up as scheduled.

## 2013-06-02 NOTE — Progress Notes (Signed)
NP gave ok to discharge pt.  Discharge instructions given and explained to pt.  Pt. Verbalized understanding of all instructions and had no questions.  IV removed previously by NT.  Pt. Discharged to home with wife and children. Vanice Sarah

## 2013-06-02 NOTE — Progress Notes (Addendum)
SUBJECTIVE:  No complaints of chest pain or SOB  OBJECTIVE:   Vitals:   Filed Vitals:   06/01/13 2332 06/01/13 2333 06/02/13 0513 06/02/13 0933  BP: 149/96 149/96 141/97 148/88  Pulse:   90 96  Temp:   98.4 F (36.9 C)   TempSrc:   Oral   Resp:   18   Height:      Weight:      SpO2:   99%      PHYSICAL EXAM General: Well developed, well nourished, in no acute distress Head: Eyes PERRLA, No xanthomas.   Normal cephalic and atramatic  Lungs:   Clear bilaterally to auscultation and percussion. Heart:   HRRR S1 S2 Pulses are 2+ & equal. Abdomen: Bowel sounds are positive, abdomen soft and non-tender without masses Extremities:   No clubbing, cyanosis or edema.  DP +1 Neuro: Alert and oriented X 3. Psych:  Good affect, responds appropriately   LABS: Basic Metabolic Panel:  Recent Labs  78/29/5600/09/08 06/01/13 1330  NA 130* 126*  K 3.9 5.4*  CL 87* 86*  CO2 26 21  GLUCOSE 108* 99  BUN 8 7  CREATININE 0.61 0.59  CALCIUM 9.9 9.1   Liver Function Tests:  Recent Labs  05/31/13 1150 06/01/13  AST 652* 469*  ALT 284* 364*  ALKPHOS 157* 145*  BILITOT 0.8 2.0*  PROT 7.0 8.7*  ALBUMIN 3.6 4.3   No results found for this basename: LIPASE, AMYLASE,  in the last 72 hours CBC:  Recent Labs  05/31/13 1150 05/31/13 1153  WBC 6.7  --   HGB 14.5 16.0  HCT 39.2 47.0  MCV 90.3  --   PLT 189  --    Cardiac Enzymes:  Recent Labs  05/31/13 1150 05/31/13 1455 05/31/13 1955 06/01/13 0109  CKTOTAL 160  --   --   --   CKMB 3.7  --   --   --   TROPONINI <0.30 <0.30 <0.30 <0.30   BNP: No components found with this basename: POCBNP,  D-Dimer:  Recent Labs  05/31/13 1455  DDIMER 0.81*   Hemoglobin A1C:  Recent Labs  05/31/13 1150  HGBA1C 5.8*   Fasting Lipid Panel:  Recent Labs  06/01/13 2345  CHOL 377*  HDL 38*  LDLCALC UNABLE TO CALCULATE IF TRIGLYCERIDE OVER 400 mg/dL  TRIG 213704*  CHOLHDL 9.9   Thyroid Function Tests:  Recent Labs   05/31/13 1455  TSH 3.363   Anemia Panel: No results found for this basename: VITAMINB12, FOLATE, FERRITIN, TIBC, IRON, RETICCTPCT,  in the last 72 hours Coag Panel:   Lab Results  Component Value Date   INR 1.10 05/31/2013    RADIOLOGY: Ct Angio Chest Pe W/cm &/or Wo Cm  06/01/2013   CLINICAL DATA:  Chest pain, tachycardia  EXAM: CT ANGIOGRAPHY CHEST WITH CONTRAST  TECHNIQUE: Multidetector CT imaging of the chest was performed using the standard protocol during bolus administration of intravenous contrast. Multiplanar CT image reconstructions including MIPs were obtained to evaluate the vascular anatomy.  CONTRAST:  100mL OMNIPAQUE IOHEXOL 350 MG/ML SOLN  COMPARISON:  None.  FINDINGS: There is no pulmonary embolus. The aorta is normal. There is no mediastinal or hilar lymphadenopathy. The heart size is normal. There is no pericardial effusion. Images of the lungs demonstrate minor bilateral apical scar. There is no pulmonary infiltrate or pleural effusion. Images of the visualized upper abdominal structures demonstrate diffuse fatty infiltration of the liver. The other visualized upper abdominal structures are unremarkable. No  acute abnormalities identified within the visualized bones.  Review of the MIP images confirms the above findings.  IMPRESSION: No pulmonary embolus. No acute abnormality identified within the chest.   Electronically Signed   By: Sherian Rein M.D.   On: 06/01/2013 11:38   US Abdomen Complete  06/01/2013   CLINICAL DATA:  Abnormal liver function tests.  EXAM: ULTRASOUND ABDOMEN COMPLETE  COMPARISON:  None.  FINDINGS: Gallbladder:  No gallstones or wall thickening visualized. No sonographic Murphy sign noted.  Common bile duct:  Diameter: 0.4 cm.  Liver:  No focal liver lesion or intrahepatic biliary ductal dilatation is identified. Increased echogenicity is compatible fatty infiltration.  IVC:  No abnormality visualized.  Pancreas:  Visualized portion unremarkable.  Spleen:  Size  and appearance within normal limits.  Right Kidney:  Length: 11.0 cm. Echogenicity within normal limits. No mass or hydronephrosis visualized.  Left Kidney:  Length: 11.9 cm. Echogenicity within normal limits. No hydronephrosis or stone. 1.2 cm simple lower pole cyst noted.  Abdominal aorta:  No aneurysm visualized.  Other findings:  None.  IMPRESSION: Fatty infiltration liver.  The study is otherwise negative   Electronically Signed   By: Drusilla Kanner M.D.   On: 06/01/2013 23:38   Assessment/Plan:  1. Chest pain - cath unremarkable, normal LV function, no pericardial effusion.  2. Tachycardia, unexplained. Elevated D-dimer - Chest  CTA results with no PE and no pathology in the chest.  I think we have ruled out all serious causes of chest pain/tachycardia. Data reviewed including labs, cath, and echo.  3.  LFT's reviewed. Transaminases markedly elevated. Pt without abdominal pain or GI symptoms.  Abdominal ultrasound with no acute abnormality except fatty liver. Repeat LFT's this am. Will ask GI to see. He may have alcoholic hepatitis as he admits to heavy Etoh.  4.  Lipids markedly abnormal.  His Total cholesterol is 377 with triglycerides of 704 and HDL 38.  Could not calculate LDL due to high trigylerides. Await input from GI if safe to start cholesterol lowering drugs.  Would probably start with Lovaza to get triglycerides down +/- fenofibrate if ok with GI  5.  Discharge later today to home if ok with GI 5.  HTN still with elevated BP - will increase Metoprolol to 50mg  BID for better HR and BP control       Quintella Reichert, MD  06/02/2013  10:11 AM

## 2013-06-02 NOTE — Progress Notes (Signed)
[facility] notified of consult.

## 2013-06-20 ENCOUNTER — Ambulatory Visit (INDEPENDENT_AMBULATORY_CARE_PROVIDER_SITE_OTHER): Payer: BC Managed Care – PPO | Admitting: Cardiovascular Disease

## 2013-06-20 ENCOUNTER — Encounter: Payer: Self-pay | Admitting: Cardiovascular Disease

## 2013-06-20 VITALS — BP 150/84 | HR 71 | Wt 175.0 lb

## 2013-06-20 DIAGNOSIS — K7689 Other specified diseases of liver: Secondary | ICD-10-CM

## 2013-06-20 DIAGNOSIS — I10 Essential (primary) hypertension: Secondary | ICD-10-CM

## 2013-06-20 DIAGNOSIS — K760 Fatty (change of) liver, not elsewhere classified: Secondary | ICD-10-CM

## 2013-06-20 DIAGNOSIS — R079 Chest pain, unspecified: Secondary | ICD-10-CM

## 2013-06-20 DIAGNOSIS — E785 Hyperlipidemia, unspecified: Secondary | ICD-10-CM

## 2013-06-20 MED ORDER — FISH OIL 1200 MG PO CAPS: 3.0000 | ORAL_CAPSULE | Freq: Every day | ORAL | Status: DC

## 2013-06-20 MED ORDER — CARVEDILOL 12.5 MG PO TABS: 12.5000 mg | ORAL_TABLET | Freq: Two times a day (BID) | ORAL | Status: DC

## 2013-06-20 NOTE — Assessment & Plan Note (Signed)
He has a followup appointment with Dr. Dulce Sellar.

## 2013-06-20 NOTE — Assessment & Plan Note (Addendum)
He could not obtain Lovaza which was not covered by his insurance. I instructed him to take 3 capsules of over-the-counter fish oil (1200 mg). I suspect that excessive alcohol use is contributing to his hyperlipidemia and extremely elevated triglyceride. I advised him to discontinue alcohol use. Repeat fasting lipid and liver profile within one week. Treatment with a statin can be considered if liver enzymes are improving. I also provided him with instructions about low fat diet and asked him to decrease carbohydrate intake

## 2013-06-20 NOTE — Assessment & Plan Note (Signed)
This has resolved completely. Recent cardiac catheterization on CTA of the chest were both unremarkable.

## 2013-06-20 NOTE — Progress Notes (Signed)
HPI  This is a pleasant 48 year old [place] man who is here today for a followup visit after recent hospitalization. He has known history of hypertension and excessive alcohol use. [facility] with shortness of breath and chest pain. His EKG showed mild anterior ST elevation and thus he was taken emergently to the cath lab. Cardiac catheterization showed no evidence of coronary artery disease. His arteries were tortuous suggestive of hypertensive changes. D-dimer was mildly elevated. CTA of the chest showed no evidence of pulmonary embolism. His labs showed moderately elevated liver enzymes. Lipid profile showed severe elevation in triglyceride. Initially it was 3000 and her because 700. The patient reports drinking 1 pint of vodka the night before admission. Ultrasound of the liver showed fatty infiltrate. He was seen by Dr. Dulce Summers from gastroenterology. He was suspected of having alcohol induced liver injury with possible underlying non-alcoholic steatohepatitis. The patient was discharged home on Lovaza but he could not obtain this medication as it was not covered by his insurance. He has been doing well and denies any chest pain or dyspnea. He did not quit drinking alcohol. Usually he drinks 3 beers a day but recently he has been switching to vodka and usually drinks 1 pint every few days. It is possible that he is underestimating the amount of coffee drinking. He works at [employer].      No Known Allergies   Current Outpatient Prescriptions on File Prior to Visit  Medication Sig Dispense Refill  . lisinopril-hydrochlorothiazide (PRINZIDE,ZESTORETIC) 20-25 MG per tablet Take 1 tablet by mouth daily.      . potassium chloride 20 MEQ TBCR Take 20 mEq by mouth daily.  30 tablet  2   No current facility-administered medications on file prior to visit.     Past Medical History  Diagnosis Date  . Arthritis     "left groin & right foot pains only when  weather gets cold" (05/31/2013)  . Chest pain     a. 05/2013 Ant STEMI called but cath showed nl cors and EF 55-60%, Trop neg, D-dimer elevated->CTA  . Steatosis, liver 06/02/2013  . Hyperlipidemia   . HTN (hypertension)      Past Surgical History  Procedure Laterality Date  . Cardiac catheterization  05/31/2013    Normal but tortuous coronary arteries suggestive of hypertensive changes     Family History  Problem Relation Age of Onset  . Coronary artery disease Mother     CABG     History   Social History  . Marital Status: Married    Spouse Name: N/A    Number of Children: N/A  . Years of Education: N/A   Occupational History  . Not on file.   Social History Main Topics  . Smoking status: Light Tobacco Smoker    Types: Cigars  . Smokeless tobacco: Never Used  . Alcohol Use: No  . Drug Use: No  . Sexual Activity: Yes     Comment: 05/31/2013 "might smoke a Black N Mild once/wk"   Other Topics Concern  . Not on file   Social History Narrative   Married, one child, works at [employer]     PHYSICAL EXAM   BP 150/84  Pulse 71  Wt 175 lb (79.379 kg) Constitutional: He is oriented to person, place, and time. He appears well-developed and well-nourished. No distress.  HENT: No nasal discharge.  Head: Normocephalic and atraumatic.  Eyes: Pupils are equal and round.  No  discharge. Neck: Normal range of motion. Neck supple. No JVD present. No thyromegaly present.  Cardiovascular: Normal rate, regular rhythm, normal heart sounds. Exam reveals no gallop and no friction rub. No murmur heard.  Pulmonary/Chest: Effort normal and breath sounds normal. No stridor. No respiratory distress. He has no wheezes. He has no rales. He exhibits no tenderness.  Abdominal: Soft. Bowel sounds are normal. He exhibits no distension. There is no tenderness. There is no rebound and no guarding.  Musculoskeletal: Normal range of motion. He exhibits no edema and no tenderness.  Neurological: He is  alert and oriented to person, place, and time. Coordination normal.  Skin: Skin is warm and dry. No rash noted. He is not diaphoretic. No erythema. No pallor.  Psychiatric: He has a normal mood and affect. His behavior is normal. Judgment and thought content normal.  Right radial pulse is normal with no hematoma   ASSESSMENT AND PLAN

## 2013-06-20 NOTE — Patient Instructions (Addendum)
Fasting labs to be done within 1 week.   Make the following changes to your medications: 1. Stop Metoprolol.  2. Start Carvedilol 12.5 mg twice daily. 3. Increase Fish oil to 3 capsules daily.   Follow up in 2 months.  Fat and Cholesterol Control Diet Fat and cholesterol levels in your blood and organs are influenced by your diet. High levels of fat and cholesterol may lead to diseases of the heart, small and large blood vessels, gallbladder, liver, and pancreas. CONTROLLING FAT AND CHOLESTEROL WITH DIET Although exercise and lifestyle factors are important, your diet is key. That is because certain foods are known to raise cholesterol and others to lower it. The goal is to balance foods for their effect on cholesterol and more importantly, to replace saturated and trans fat with other types of fat, such as monounsaturated fat, polyunsaturated fat, and omega-3 fatty acids. On average, a person should consume no more than 15 to 17 g of saturated fat daily. Saturated and trans fats are considered "bad" fats, and they will raise LDL cholesterol. Saturated fats are primarily found in animal products such as meats, butter, and cream. However, that does not mean you need to give up all your favorite foods. Today, there are good tasting, low-fat, low-cholesterol substitutes for most of the things you like to eat. Choose low-fat or nonfat alternatives. Choose round or loin cuts of red meat. These types of cuts are lowest in fat and cholesterol. Chicken (without the skin), fish, veal, and ground [place] breast are great choices. Eliminate fatty meats, such as hot dogs and salami. Even shellfish have little or no saturated fat. Have a 3 oz (85 g) portion when you eat lean meat, poultry, or fish. Trans fats are also called "partially hydrogenated oils." They are oils that have been scientifically manipulated so that they are solid at room temperature resulting in a longer shelf life and improved taste and texture of  foods in which they are added. Trans fats are found in stick margarine, some tub margarines, cookies, crackers, and baked goods.  When baking and cooking, oils are a great substitute for butter. The monounsaturated oils are especially beneficial since it is believed they lower LDL and raise HDL. The oils you should avoid entirely are saturated tropical oils, such as coconut and palm.  Remember to eat a lot from food groups that are naturally free of saturated and trans fat, including fish, fruit, vegetables, beans, grains (barley, rice, couscous, bulgur wheat), and pasta (without cream sauces).  IDENTIFYING FOODS THAT LOWER FAT AND CHOLESTEROL  Soluble fiber may lower your cholesterol. This type of fiber is found in fruits such as apples, vegetables such as broccoli, potatoes, and carrots, legumes such as beans, peas, and lentils, and grains such as barley. Foods fortified with plant sterols (phytosterol) may also lower cholesterol. You should eat at least 2 g per day of these foods for a cholesterol lowering effect.  Read package labels to identify low-saturated fats, trans fat free, and low-fat foods at the supermarket. Select cheeses that have only 2 to 3 g saturated fat per ounce. Use a heart-healthy tub margarine that is free of trans fats or partially hydrogenated oil. When buying baked goods (cookies, crackers), avoid partially hydrogenated oils. Breads and muffins should be made from whole grains (whole-wheat or whole oat flour, instead of "flour" or "enriched flour"). Buy non-creamy canned soups with reduced salt and no added fats.  FOOD PREPARATION TECHNIQUES  Never deep-fry. If you must fry,  either stir-fry, which uses very little fat, or use non-stick cooking sprays. When possible, broil, bake, or roast meats, and steam vegetables. Instead of putting butter or margarine on vegetables, use lemon and herbs, applesauce, and cinnamon (for squash and sweet potatoes). Use nonfat yogurt, salsa, and  low-fat dressings for salads.  LOW-SATURATED FAT / LOW-FAT FOOD SUBSTITUTES Meats / Saturated Fat (g)  Avoid: Steak, marbled (3 oz/85 g) / 11 g  Choose: Steak, lean (3 oz/85 g) / 4 g  Avoid: Hamburger (3 oz/85 g) / 7 g  Choose: Hamburger, lean (3 oz/85 g) / 5 g  Avoid: Ham (3 oz/85 g) / 6 g  Choose: Ham, lean cut (3 oz/85 g) / 2.4 g  Avoid: Chicken, with skin, dark meat (3 oz/85 g) / 4 g  Choose: Chicken, skin removed, dark meat (3 oz/85 g) / 2 g  Avoid: Chicken, with skin, light meat (3 oz/85 g) / 2.5 g  Choose: Chicken, skin removed, light meat (3 oz/85 g) / 1 g Dairy / Saturated Fat (g)  Avoid: Whole milk (1 cup) / 5 g  Choose: Low-fat milk, 2% (1 cup) / 3 g  Choose: Low-fat milk, 1% (1 cup) / 1.5 g  Choose: Skim milk (1 cup) / 0.3 g  Avoid: Hard cheese (1 oz/28 g) / 6 g  Choose: Skim milk cheese (1 oz/28 g) / 2 to 3 g  Avoid: Cottage cheese, 4% fat (1 cup) / 6.5 g  Choose: Low-fat cottage cheese, 1% fat (1 cup) / 1.5 g  Avoid: Ice cream (1 cup) / 9 g  Choose: Sherbet (1 cup) / 2.5 g  Choose: Nonfat frozen yogurt (1 cup) / 0.3 g  Choose: Frozen fruit bar / trace  Avoid: Whipped cream (1 tbs) / 3.5 g  Choose: Nondairy whipped topping (1 tbs) / 1 g Condiments / Saturated Fat (g)  Avoid: Mayonnaise (1 tbs) / 2 g  Choose: Low-fat mayonnaise (1 tbs) / 1 g  Avoid: Butter (1 tbs) / 7 g  Choose: Extra light margarine (1 tbs) / 1 g  Avoid: Coconut oil (1 tbs) / 11.8 g  Choose: Olive oil (1 tbs) / 1.8 g  Choose: Corn oil (1 tbs) / 1.7 g  Choose: Safflower oil (1 tbs) / 1.2 g  Choose: Sunflower oil (1 tbs) / 1.4 g  Choose: Soybean oil (1 tbs) / 2.4 g  Choose: Canola oil (1 tbs) / 1 g Document Released: 04/13/2005 Document Revised: 08/08/2012 Document Reviewed: 10/02/2010 Exit[employer] Patient Information 2014 [place]

## 2013-06-20 NOTE — Assessment & Plan Note (Addendum)
His blood pressure continues to be uncontrolled. I switched metoprolol to carvedilol 12.5 mg twice daily which has a better antihypertensive effect. Continue treatment with lisinopril and hydrochlorothiazide. If blood pressure continues to be elevated, I recommend adding amlodipine. I had a prolonged discussion with him about the importance of optimal control of his high blood pressure. Check basic metabolic profile to see if treatment with potassium is still needed.

## 2013-09-14 ENCOUNTER — Other Ambulatory Visit: Payer: Self-pay

## 2013-09-14 MED ORDER — POTASSIUM CHLORIDE ER 20 MEQ PO TBCR: 20.0000 meq | EXTENDED_RELEASE_TABLET | Freq: Every day | ORAL | Status: DC

## 2014-01-05 ENCOUNTER — Other Ambulatory Visit: Payer: Self-pay

## 2014-01-05 DIAGNOSIS — E785 Hyperlipidemia, unspecified: Secondary | ICD-10-CM

## 2014-01-05 MED ORDER — CARVEDILOL 25 MG PO TABS: ORAL_TABLET | ORAL | Status: DC

## 2014-01-15 ENCOUNTER — Other Ambulatory Visit: Payer: Self-pay

## 2014-01-15 MED ORDER — POTASSIUM CHLORIDE ER 20 MEQ PO TBCR: 20.0000 meq | EXTENDED_RELEASE_TABLET | Freq: Every day | ORAL | Status: DC

## 2014-04-05 ENCOUNTER — Encounter (HOSPITAL_COMMUNITY): Payer: Self-pay | Admitting: Cardiovascular Disease

## 2015-01-13 ENCOUNTER — Other Ambulatory Visit: Payer: Self-pay | Admitting: Cardiovascular Disease

## 2015-05-02 ENCOUNTER — Inpatient Hospital Stay (HOSPITAL_COMMUNITY)
Admission: EM | Admit: 2015-05-02 | Discharge: 2015-05-05 | DRG: 204 | Disposition: A | Discharge status: Home / Self Care | Payer: BLUE CROSS/BLUE SHIELD | Attending: Internal Medicine | Admitting: Internal Medicine

## 2015-05-02 ENCOUNTER — Encounter (HOSPITAL_COMMUNITY): Payer: Self-pay | Admitting: *Deleted

## 2015-05-02 ENCOUNTER — Emergency Department (HOSPITAL_COMMUNITY): Payer: BLUE CROSS/BLUE SHIELD

## 2015-05-02 DIAGNOSIS — K7460 Unspecified cirrhosis of liver: Secondary | ICD-10-CM | POA: Diagnosis present

## 2015-05-02 DIAGNOSIS — I10 Essential (primary) hypertension: Secondary | ICD-10-CM | POA: Diagnosis present

## 2015-05-02 DIAGNOSIS — E781 Pure hyperglyceridemia: Secondary | ICD-10-CM | POA: Diagnosis present

## 2015-05-02 DIAGNOSIS — R0600 Dyspnea, unspecified: Secondary | ICD-10-CM | POA: Diagnosis not present

## 2015-05-02 DIAGNOSIS — E876 Hypokalemia: Secondary | ICD-10-CM | POA: Diagnosis present

## 2015-05-02 DIAGNOSIS — E785 Hyperlipidemia, unspecified: Secondary | ICD-10-CM

## 2015-05-02 DIAGNOSIS — I252 Old myocardial infarction: Secondary | ICD-10-CM

## 2015-05-02 DIAGNOSIS — E860 Dehydration: Secondary | ICD-10-CM | POA: Diagnosis present

## 2015-05-02 DIAGNOSIS — R0602 Shortness of breath: Principal | ICD-10-CM

## 2015-05-02 DIAGNOSIS — D696 Thrombocytopenia, unspecified: Secondary | ICD-10-CM | POA: Diagnosis present

## 2015-05-02 DIAGNOSIS — E872 Acidosis: Secondary | ICD-10-CM | POA: Diagnosis present

## 2015-05-02 DIAGNOSIS — F1010 Alcohol abuse, uncomplicated: Secondary | ICD-10-CM

## 2015-05-02 DIAGNOSIS — Z87891 Personal history of nicotine dependence: Secondary | ICD-10-CM | POA: Diagnosis not present

## 2015-05-02 DIAGNOSIS — D539 Nutritional anemia, unspecified: Secondary | ICD-10-CM | POA: Diagnosis present

## 2015-05-02 DIAGNOSIS — R0609 Other forms of dyspnea: Secondary | ICD-10-CM | POA: Diagnosis not present

## 2015-05-02 DIAGNOSIS — K7010 Alcoholic hepatitis without ascites: Secondary | ICD-10-CM | POA: Diagnosis present

## 2015-05-02 DIAGNOSIS — F1020 Alcohol dependence, uncomplicated: Secondary | ICD-10-CM | POA: Diagnosis present

## 2015-05-02 DIAGNOSIS — K759 Inflammatory liver disease, unspecified: Secondary | ICD-10-CM

## 2015-05-02 DIAGNOSIS — E871 Hypo-osmolality and hyponatremia: Secondary | ICD-10-CM

## 2015-05-02 LAB — CBC WITH DIFFERENTIAL/PLATELET
BASOS ABS: 0 10*3/uL (ref 0.0–0.1)
Basophils Relative: 0 %
EOS ABS: 0 10*3/uL (ref 0.0–0.7)
Eosinophils Relative: 0 %
HEMATOCRIT: 29.8 % — AB (ref 39.0–52.0)
Hemoglobin: 10.7 g/dL — ABNORMAL LOW (ref 13.0–17.0)
LYMPHS ABS: 2.5 10*3/uL (ref 0.7–4.0)
Lymphocytes Relative: 33 %
MCH: 36.5 pg — ABNORMAL HIGH (ref 26.0–34.0)
MCHC: 35.9 g/dL (ref 30.0–36.0)
MCV: 101.7 fL — ABNORMAL HIGH (ref 78.0–100.0)
MONO ABS: 0.6 10*3/uL (ref 0.1–1.0)
Monocytes Relative: 8 %
NEUTROS ABS: 4.4 10*3/uL (ref 1.7–7.7)
Neutrophils Relative %: 59 %
PLATELETS: 133 10*3/uL — AB (ref 150–400)
RBC: 2.93 MIL/uL — AB (ref 4.22–5.81)
RDW: 16.4 % — AB (ref 11.5–15.5)
WBC: 7.5 10*3/uL (ref 4.0–10.5)

## 2015-05-02 LAB — COMPREHENSIVE METABOLIC PANEL
ALBUMIN: 3 g/dL — AB (ref 3.5–5.0)
ALT: 97 U/L — ABNORMAL HIGH (ref 17–63)
ANION GAP: 20 — AB (ref 5–15)
AST: 337 U/L — ABNORMAL HIGH (ref 15–41)
Alkaline Phosphatase: 322 U/L — ABNORMAL HIGH (ref 38–126)
BILIRUBIN TOTAL: 9.4 mg/dL — AB (ref 0.3–1.2)
BUN: 8 mg/dL (ref 6–20)
CALCIUM: 8.2 mg/dL — AB (ref 8.9–10.3)
CHLORIDE: 85 mmol/L — AB (ref 101–111)
CO2: 17 mmol/L — ABNORMAL LOW (ref 22–32)
Creatinine, Ser: 0.71 mg/dL (ref 0.61–1.24)
GFR calc Af Amer: >60 mL/min (ref >60)
GFR calc non Af Amer: >60 mL/min (ref >60)
GLUCOSE: 64 mg/dL — AB (ref 65–99)
POTASSIUM: 5.1 mmol/L (ref 3.5–5.1)
SODIUM: 122 mmol/L — AB (ref 135–145)
TOTAL PROTEIN: 6 g/dL — AB (ref 6.5–8.1)

## 2015-05-02 LAB — I-STAT ARTERIAL BLOOD GAS, ED
ACID-BASE DEFICIT: 1 mmol/L (ref 0.0–2.0)
Bicarbonate: 22.2 mEq/L (ref 20.0–24.0)
O2 SAT: 97 %
PCO2 ART: 30.6 mmHg — AB (ref 35.0–45.0)
PH ART: 7.47 — AB (ref 7.350–7.450)
Patient temperature: 98.6
TCO2: 23 mmol/L (ref 0–100)
pO2, Arterial: 80 mmHg (ref 80.0–100.0)

## 2015-05-02 LAB — SODIUM, URINE, RANDOM: Sodium, Ur: 52 mmol/L

## 2015-05-02 LAB — CBG MONITORING, ED
GLUCOSE-CAPILLARY: 64 mg/dL — AB (ref 65–99)
GLUCOSE-CAPILLARY: 107 mg/dL — AB (ref 65–99)
Glucose-Capillary: 65 mg/dL (ref 65–99)

## 2015-05-02 LAB — OSMOLALITY, URINE: Osmolality, Ur: 404 mOsm/kg (ref 300–900)

## 2015-05-02 LAB — ETHANOL: Alcohol, Ethyl (B): 71 mg/dL — ABNORMAL HIGH (ref <5)

## 2015-05-02 MED ORDER — SODIUM CHLORIDE 0.9 % IV BOLUS (SEPSIS)
1000.0000 mL | Freq: Once | INTRAVENOUS | Status: AC
  Administered 2015-05-02: 1000 mL via INTRAVENOUS

## 2015-05-02 MED ORDER — THIAMINE HCL 100 MG/ML IJ SOLN
100.0000 mg | Freq: Every day | INTRAMUSCULAR | Status: DC
  Administered 2015-05-02: 100 mg via INTRAVENOUS
  Filled 2015-05-02: qty 2

## 2015-05-02 MED ORDER — LORAZEPAM 1 MG PO TABS
1.0000 mg | ORAL_TABLET | Freq: Once | ORAL | Status: AC
  Administered 2015-05-02: 1 mg via ORAL
  Filled 2015-05-02: qty 1

## 2015-05-02 MED ORDER — LORAZEPAM 1 MG PO TABS: 0.0000 mg | ORAL_TABLET | Freq: Four times a day (QID) | ORAL | Status: DC

## 2015-05-02 MED ORDER — ALBUTEROL SULFATE (2.5 MG/3ML) 0.083% IN NEBU
INHALATION_SOLUTION | RESPIRATORY_TRACT | Status: AC
  Filled 2015-05-02: qty 6

## 2015-05-02 MED ORDER — LORAZEPAM 2 MG/ML IJ SOLN: 0.0000 mg | Freq: Two times a day (BID) | INTRAMUSCULAR | Status: DC

## 2015-05-02 MED ORDER — ALBUTEROL SULFATE (2.5 MG/3ML) 0.083% IN NEBU
5.0000 mg | INHALATION_SOLUTION | Freq: Once | RESPIRATORY_TRACT | Status: AC
  Administered 2015-05-02: 5 mg via RESPIRATORY_TRACT

## 2015-05-02 MED ORDER — VITAMIN B-1 100 MG PO TABS
100.0000 mg | ORAL_TABLET | Freq: Every day | ORAL | Status: DC
  Administered 2015-05-02: 100 mg via ORAL
  Filled 2015-05-02: qty 1

## 2015-05-02 MED ORDER — LORAZEPAM 2 MG/ML IJ SOLN: 0.0000 mg | Freq: Four times a day (QID) | INTRAMUSCULAR | Status: DC

## 2015-05-02 MED ORDER — LORAZEPAM 1 MG PO TABS: 0.0000 mg | ORAL_TABLET | Freq: Two times a day (BID) | ORAL | Status: DC

## 2015-05-02 NOTE — ED Notes (Signed)
Pt given orange juice per MD order.

## 2015-05-02 NOTE — ED Notes (Addendum)
Pt provided [place] sandwich bag and orange juice per md

## 2015-05-02 NOTE — ED Notes (Addendum)
CBG 65 after Orange Juice

## 2015-05-02 NOTE — ED Notes (Signed)
Pt c/o shortness of breath intermittently x 3 weeks. States it gets worse with exertion. Denies history.

## 2015-05-02 NOTE — ED Notes (Signed)
Pt taken to xray 

## 2015-05-02 NOTE — ED Provider Notes (Signed)
CSN: [id number]     Arrival date & time 05/02/15  1524 History   First MD Initiated Contact with Patient 05/02/15 1911     Chief Complaint  Patient presents with  . Shortness of Breath     Patient is a 50 y.o. male presenting with shortness of breath. The history is provided by the patient.  Shortness of Breath Severity:  Moderate Onset quality:  Gradual Duration:  3 weeks Timing:  Intermittent Progression:  Worsening Chronicity:  New Relieved by:  Rest Worsened by:  Activity Associated symptoms: no abdominal pain, no chest pain, no cough, no fever and no syncope   Risk factors: alcohol use   Pt reports for past 3 weeks he has had intermittent episodes of SOB It usually occurs at work He will have to rest for it to improve He has a physical job at [employer] and he has to take frequent breaks He has never had this before  He reports h/o HTN but has not been med complaint When asked he drinks ETOH he states "oh I drink" but no specific amount is given  Past Medical History  Diagnosis Date  . Arthritis     "left groin & right foot pains only when weather gets cold" (05/31/2013)  . Chest pain     a. 05/2013 Ant STEMI called but cath showed nl cors and EF 55-60%, Trop neg, D-dimer elevated->CTA  . Steatosis, liver 06/02/2013  . Hyperlipidemia   . HTN (hypertension)    Past Surgical History  Procedure Laterality Date  . Cardiac catheterization  05/31/2013    Normal but tortuous coronary arteries suggestive of hypertensive changes  . Left heart catheterization with coronary angiogram N/A 05/31/2013    Procedure: LEFT HEART CATHETERIZATION WITH CORONARY ANGIOGRAM;  Surgeon: Iran Ouch, MD;  Location: [facility]ATH [facility];  Service: Cardiovascular;  Laterality: N/A;   Family History  Problem Relation Age of Onset  . Coronary artery disease Mother     CABG   Social History  Substance Use Topics  . Smoking status: Former Smoker    Types: Cigars  . Smokeless tobacco: Never Used  . Alcohol  Use: Yes     Comment: 4 days per week    Review of Systems  Constitutional: Positive for fatigue. Negative for fever.  Respiratory: Positive for shortness of breath. Negative for cough.   Cardiovascular: Negative for chest pain and syncope.  Gastrointestinal: Negative for abdominal pain.  All other systems reviewed and are negative.     Allergies  Review of patient's allergies indicates no known allergies.  Home Medications   Prior to Admission medications   Medication Sig Start Date End Date Taking? Authorizing Provider  carvedilol (COREG) 25 MG tablet Take 1/2 tablet twice a day 01/05/14   Iran Ouch, MD  lisinopril-hydrochlorothiazide (PRINZIDE,ZESTORETIC) 20-25 MG per tablet Take 1 tablet by mouth daily.    Historical Provider, MD  Omega-3 Fatty Acids (FISH OIL) 1200 MG CAPS Take 3 capsules (3,600 mg total) by mouth daily. 06/20/13   Iran Ouch, MD  Potassium Chloride ER 20 MEQ TBCR Take 20 mEq by mouth daily. 01/15/14   Iran Ouch, MD   BP 161/88 mmHg  Pulse 92  Temp(Src) 99.6 F (37.6 C) (Oral)  Resp 22  Ht 5\' 7"  (1.702 m)  Wt 79.379 kg  BMI 27.40 kg/m2  SpO2 98% Physical Exam CONSTITUTIONAL: Disheveled, smells ketotic HEAD: Normocephalic/atraumatic EYES: EOMI/PERRL, scleral icterus noted ENMT: Mucous membranes moist NECK: supple no meningeal  signs SPINE/BACK:entire spine nontender CV: S1/S2 noted, no murmurs/rubs/gallops noted LUNGS: Lungs are clear to auscultation bilaterally, no apparent distress ABDOMEN: soft, nontender, no rebound or guarding GU:no cva tenderness NEURO: Pt is awake/alert/appropriate, moves all extremitiesx4.  No facial droop.   EXTREMITIES: pulses normal/equal, full ROM SKIN: warm, color normal PSYCH: no abnormalities of mood noted, alert and oriented to situation  ED Course  Procedures  Medications  albuterol (PROVENTIL) (2.5 MG/3ML) 0.083% nebulizer solution (not administered)  LORazepam (ATIVAN) tablet 0-4 mg (not  administered)  LORazepam (ATIVAN) tablet 0-4 mg (not administered)  LORazepam (ATIVAN) injection 0-4 mg (not administered)  LORazepam (ATIVAN) injection 0-4 mg (not administered)  thiamine (VITAMIN B-1) tablet 100 mg (100 mg Oral Given 05/02/15 2223)  thiamine (B-1) injection 100 mg (100 mg Intravenous Given 05/02/15 2224)  albuterol (PROVENTIL) (2.5 MG/3ML) 0.083% nebulizer solution 5 mg (5 mg Nebulization Given 05/02/15 1536)  sodium chloride 0.9 % bolus 1,000 mL (0 mLs Intravenous Stopped 05/02/15 2100)  sodium chloride 0.9 % bolus 1,000 mL (0 mLs Intravenous Stopped 05/02/15 2227)  LORazepam (ATIVAN) tablet 1 mg (1 mg Oral Given 05/02/15 2223)    8:01 PM Pt with vague fatigue/SOB intermittent for 3 weeks CXR negative He had negative cardiac cath in 2015 Concern for metabolic derangement as cause of his SOB 10:35 PM Pt with significant metabolic derangement and liver dysfunction Will need admission IV fluids ordered CIWA protocol ordered as pt is high risk for ETOH withdrawal Labs Review Labs Reviewed  CBC WITH DIFFERENTIAL/PLATELET - Abnormal; Notable for the following:    RBC 2.93 (*)    Hemoglobin 10.7 (*)    HCT 29.8 (*)    MCV 101.7 (*)    MCH 36.5 (*)    RDW 16.4 (*)    Platelets 133 (*)    All other components within normal limits  COMPREHENSIVE METABOLIC PANEL - Abnormal; Notable for the following:    Sodium 122 (*)    Chloride 85 (*)    CO2 17 (*)    Glucose, Bld 64 (*)    Calcium 8.2 (*)    Total Protein 6.0 (*)    Albumin 3.0 (*)    AST 337 (*)    ALT 97 (*)    Alkaline Phosphatase 322 (*)    Total Bilirubin 9.4 (*)    Anion gap 20 (*)    All other components within normal limits  ETHANOL - Abnormal; Notable for the following:    Alcohol, Ethyl (B) 71 (*)    All other components within normal limits  CBG MONITORING, ED - Abnormal; Notable for the following:    Glucose-Capillary 64 (*)    All other components within normal limits  CBG MONITORING, ED - Abnormal;  Notable for the following:    Glucose-Capillary 107 (*)    All other components within normal limits  BLOOD GAS, ARTERIAL  SODIUM, URINE, RANDOM  OSMOLALITY, URINE  CBG MONITORING, ED    Imaging Review Dg Chest 2 View  05/02/2015  CLINICAL DATA:  Shortness of breath intermittently x 3 weeks. States it gets worse with exertion. EXAM: CHEST  2 VIEW COMPARISON:  06/01/2013 FINDINGS: The heart size and mediastinal contours are within normal limits. Both lungs are clear. The visualized skeletal structures are unremarkable. IMPRESSION: No active cardiopulmonary disease. Electronically Signed   By: Elige Ko   On: 05/02/2015 16:00   I have personally reviewed and evaluated these images and lab results as part of my medical decision-making.  EKG Interpretation   Date/Time:  Thursday May 02 2015 15:32:02 EST Ventricular Rate:  101 PR Interval:  162 QRS Duration: 84 QT Interval:  330 QTC Calculation: 427 R Axis:   60 Text Interpretation:  Sinus tachycardia Cannot rule out Anterior infarct ,  age undetermined Abnormal ECG When compared with ECG of 06/01/2013, HEART  RATE has increased Confirmed by [facility]  MD, DAVID ([id number]) on 05/02/2015  5:44:14 PM      MDM   Final diagnoses:  Hepatitis  Alcohol abuse  Dehydration  Hyponatremia    Nursing notes including past medical history and social history reviewed and considered in documentation xrays/imaging reviewed by myself and considered during evaluation Labs/vital reviewed myself and considered during evaluation Previous records reviewed and considered     Zadie Rhine, MD 05/02/15 2236

## 2015-05-03 ENCOUNTER — Inpatient Hospital Stay (HOSPITAL_COMMUNITY): Payer: BLUE CROSS/BLUE SHIELD

## 2015-05-03 ENCOUNTER — Encounter (HOSPITAL_COMMUNITY): Payer: Self-pay | Admitting: Internal Medicine

## 2015-05-03 DIAGNOSIS — F1010 Alcohol abuse, uncomplicated: Secondary | ICD-10-CM | POA: Diagnosis present

## 2015-05-03 DIAGNOSIS — K7010 Alcoholic hepatitis without ascites: Secondary | ICD-10-CM | POA: Diagnosis present

## 2015-05-03 DIAGNOSIS — D539 Nutritional anemia, unspecified: Secondary | ICD-10-CM | POA: Diagnosis present

## 2015-05-03 DIAGNOSIS — D696 Thrombocytopenia, unspecified: Secondary | ICD-10-CM | POA: Diagnosis present

## 2015-05-03 DIAGNOSIS — R0600 Dyspnea, unspecified: Secondary | ICD-10-CM

## 2015-05-03 DIAGNOSIS — E871 Hypo-osmolality and hyponatremia: Secondary | ICD-10-CM

## 2015-05-03 DIAGNOSIS — R0609 Other forms of dyspnea: Secondary | ICD-10-CM | POA: Diagnosis present

## 2015-05-03 LAB — CBC WITH DIFFERENTIAL/PLATELET
BASOS ABS: 0 10*3/uL (ref 0.0–0.1)
Basophils Relative: 0 %
Eosinophils Absolute: 0 10*3/uL (ref 0.0–0.7)
Eosinophils Relative: 0 %
HEMATOCRIT: 28.5 % — AB (ref 39.0–52.0)
Hemoglobin: 9.5 g/dL — ABNORMAL LOW (ref 13.0–17.0)
LYMPHS ABS: 2 10*3/uL (ref 0.7–4.0)
LYMPHS PCT: 33 %
MCH: 33.9 pg (ref 26.0–34.0)
MCHC: 33.3 g/dL (ref 30.0–36.0)
MCV: 101.8 fL — AB (ref 78.0–100.0)
Monocytes Absolute: 0.4 10*3/uL (ref 0.1–1.0)
Monocytes Relative: 6 %
NEUTROS ABS: 3.6 10*3/uL (ref 1.7–7.7)
Neutrophils Relative %: 60 %
Platelets: 94 10*3/uL — ABNORMAL LOW (ref 150–400)
RBC: 2.8 MIL/uL — AB (ref 4.22–5.81)
RDW: 15.8 % — ABNORMAL HIGH (ref 11.5–15.5)
WBC: 6 10*3/uL (ref 4.0–10.5)

## 2015-05-03 LAB — IRON AND TIBC
IRON: 186 ug/dL — AB (ref 45–182)
SATURATION RATIOS: 89 % — AB (ref 17.9–39.5)
TIBC: 209 ug/dL — AB (ref 250–450)
UIBC: 23 ug/dL

## 2015-05-03 LAB — HEPATIC FUNCTION PANEL
ALBUMIN: 2.5 g/dL — AB (ref 3.5–5.0)
ALT: 76 U/L — AB (ref 17–63)
AST: 254 U/L — ABNORMAL HIGH (ref 15–41)
Alkaline Phosphatase: 274 U/L — ABNORMAL HIGH (ref 38–126)
BILIRUBIN INDIRECT: 3.1 mg/dL — AB (ref 0.3–0.9)
Bilirubin, Direct: 5.1 mg/dL — ABNORMAL HIGH (ref 0.1–0.5)
TOTAL PROTEIN: 5.3 g/dL — AB (ref 6.5–8.1)
Total Bilirubin: 8.2 mg/dL — ABNORMAL HIGH (ref 0.3–1.2)

## 2015-05-03 LAB — BASIC METABOLIC PANEL
ANION GAP: 13 (ref 5–15)
ANION GAP: 12 (ref 5–15)
ANION GAP: 15 (ref 5–15)
BUN: <5 mg/dL — ABNORMAL LOW (ref 6–20)
BUN: <5 mg/dL — ABNORMAL LOW (ref 6–20)
BUN: <5 mg/dL — ABNORMAL LOW (ref 6–20)
CHLORIDE: 93 mmol/L — AB (ref 101–111)
CHLORIDE: 94 mmol/L — AB (ref 101–111)
CHLORIDE: 92 mmol/L — AB (ref 101–111)
CO2: 20 mmol/L — AB (ref 22–32)
CO2: 22 mmol/L (ref 22–32)
CO2: 21 mmol/L — ABNORMAL LOW (ref 22–32)
CREATININE: 0.57 mg/dL — AB (ref 0.61–1.24)
Calcium: 7.9 mg/dL — ABNORMAL LOW (ref 8.9–10.3)
Calcium: 8 mg/dL — ABNORMAL LOW (ref 8.9–10.3)
Calcium: 8.5 mg/dL — ABNORMAL LOW (ref 8.9–10.3)
Creatinine, Ser: 0.69 mg/dL (ref 0.61–1.24)
Creatinine, Ser: 0.66 mg/dL (ref 0.61–1.24)
GFR calc non Af Amer: >60 mL/min (ref >60)
GFR calc non Af Amer: >60 mL/min (ref >60)
GFR calc non Af Amer: >60 mL/min (ref >60)
GLUCOSE: 117 mg/dL — AB (ref 65–99)
Glucose, Bld: 113 mg/dL — ABNORMAL HIGH (ref 65–99)
Glucose, Bld: 123 mg/dL — ABNORMAL HIGH (ref 65–99)
POTASSIUM: 3.2 mmol/L — AB (ref 3.5–5.1)
POTASSIUM: 3.1 mmol/L — AB (ref 3.5–5.1)
Potassium: 3.3 mmol/L — ABNORMAL LOW (ref 3.5–5.1)
SODIUM: 128 mmol/L — AB (ref 135–145)
SODIUM: 128 mmol/L — AB (ref 135–145)
Sodium: 126 mmol/L — ABNORMAL LOW (ref 135–145)

## 2015-05-03 LAB — RAPID URINE DRUG SCREEN, HOSP PERFORMED
AMPHETAMINES: NOT DETECTED
Barbiturates: NOT DETECTED
Benzodiazepines: NOT DETECTED
Cocaine: NOT DETECTED
OPIATES: NOT DETECTED
TETRAHYDROCANNABINOL: NOT DETECTED

## 2015-05-03 LAB — LIPID PANEL
CHOLESTEROL: 988 mg/dL — AB (ref 0–200)
LDL Cholesterol: UNDETERMINED mg/dL (ref 0–99)
TRIGLYCERIDES: 1782 mg/dL — AB (ref <150)
VLDL: UNDETERMINED mg/dL (ref 0–40)

## 2015-05-03 LAB — VITAMIN B12: VITAMIN B 12: 1209 pg/mL — AB (ref 180–914)

## 2015-05-03 LAB — CBC
HCT: 27.8 % — ABNORMAL LOW (ref 39.0–52.0)
HEMOGLOBIN: 9.4 g/dL — AB (ref 13.0–17.0)
MCH: 34.3 pg — AB (ref 26.0–34.0)
MCHC: 33.8 g/dL (ref 30.0–36.0)
MCV: 101.5 fL — ABNORMAL HIGH (ref 78.0–100.0)
PLATELETS: 98 10*3/uL — AB (ref 150–400)
RBC: 2.74 MIL/uL — AB (ref 4.22–5.81)
RDW: 15.8 % — ABNORMAL HIGH (ref 11.5–15.5)
WBC: 6 10*3/uL (ref 4.0–10.5)

## 2015-05-03 LAB — D-DIMER, QUANTITATIVE (NOT AT ARMC): D DIMER QUANT: 2.48 ug{FEU}/mL — AB (ref 0.00–0.50)

## 2015-05-03 LAB — PROTIME-INR
INR: 1.31 (ref 0.00–1.49)
Prothrombin Time: 16.4 seconds — ABNORMAL HIGH (ref 11.6–15.2)

## 2015-05-03 LAB — RETICULOCYTES
RBC.: 2.74 MIL/uL — ABNORMAL LOW (ref 4.22–5.81)
RETIC COUNT ABSOLUTE: 65.8 10*3/uL (ref 19.0–186.0)
Retic Ct Pct: 2.4 % (ref 0.4–3.1)

## 2015-05-03 LAB — FERRITIN: Ferritin: 4799 ng/mL — ABNORMAL HIGH (ref 24–336)

## 2015-05-03 LAB — ACETAMINOPHEN LEVEL: Acetaminophen (Tylenol), Serum: <10 ug/mL — ABNORMAL LOW (ref 10–30)

## 2015-05-03 LAB — BRAIN NATRIURETIC PEPTIDE: B NATRIURETIC PEPTIDE 5: 83.8 pg/mL (ref 0.0–100.0)

## 2015-05-03 LAB — SALICYLATE LEVEL: Salicylate Lvl: 4.8 mg/dL (ref 2.8–30.0)

## 2015-05-03 LAB — FOLATE: FOLATE: 8.4 ng/mL (ref >5.9)

## 2015-05-03 LAB — TROPONIN I: TROPONIN I: 0.03 ng/mL (ref <0.031)

## 2015-05-03 MED ORDER — LORAZEPAM 1 MG PO TABS: 0.0000 mg | ORAL_TABLET | Freq: Four times a day (QID) | ORAL | Status: AC

## 2015-05-03 MED ORDER — THIAMINE HCL 100 MG/ML IJ SOLN
100.0000 mg | Freq: Every day | INTRAMUSCULAR | Status: DC
Start: 2015-05-03 — End: 2015-05-03

## 2015-05-03 MED ORDER — IOHEXOL 350 MG/ML SOLN
100.0000 mL | Freq: Once | INTRAVENOUS | Status: AC | PRN
  Administered 2015-05-03: 100 mL via INTRAVENOUS

## 2015-05-03 MED ORDER — FOLIC ACID 1 MG PO TABS
1.0000 mg | ORAL_TABLET | Freq: Every day | ORAL | Status: DC
  Administered 2015-05-03 – 2015-05-05 (×3): 1 mg via ORAL
  Filled 2015-05-03 (×3): qty 1

## 2015-05-03 MED ORDER — SODIUM CHLORIDE 0.9 % IJ SOLN
3.0000 mL | Freq: Two times a day (BID) | INTRAMUSCULAR | Status: DC
  Administered 2015-05-03 – 2015-05-05 (×5): 3 mL via INTRAVENOUS

## 2015-05-03 MED ORDER — OMEGA-3-ACID ETHYL ESTERS 1 G PO CAPS
1.0000 g | ORAL_CAPSULE | Freq: Every day | ORAL | Status: DC
  Administered 2015-05-03 – 2015-05-05 (×3): 1 g via ORAL
  Filled 2015-05-03 (×3): qty 1

## 2015-05-03 MED ORDER — POTASSIUM CHLORIDE CRYS ER 20 MEQ PO TBCR
40.0000 meq | EXTENDED_RELEASE_TABLET | Freq: Once | ORAL | Status: AC
  Administered 2015-05-03: 40 meq via ORAL
  Filled 2015-05-03: qty 2

## 2015-05-03 MED ORDER — VITAMIN B-1 100 MG PO TABS
100.0000 mg | ORAL_TABLET | Freq: Every day | ORAL | Status: DC
  Administered 2015-05-03 – 2015-05-05 (×3): 100 mg via ORAL
  Filled 2015-05-03 (×3): qty 1

## 2015-05-03 MED ORDER — LORAZEPAM 2 MG/ML IJ SOLN: 1.0000 mg | Freq: Four times a day (QID) | INTRAMUSCULAR | Status: DC | PRN

## 2015-05-03 MED ORDER — ENOXAPARIN SODIUM 40 MG/0.4ML ~~LOC~~ SOLN
40.0000 mg | Freq: Every day | SUBCUTANEOUS | Status: DC
  Administered 2015-05-03 – 2015-05-05 (×3): 40 mg via SUBCUTANEOUS
  Filled 2015-05-03 (×3): qty 0.4

## 2015-05-03 MED ORDER — ONDANSETRON HCL 4 MG/2ML IJ SOLN: 4.0000 mg | Freq: Four times a day (QID) | INTRAMUSCULAR | Status: DC | PRN

## 2015-05-03 MED ORDER — LORAZEPAM 1 MG PO TABS: 1.0000 mg | ORAL_TABLET | Freq: Four times a day (QID) | ORAL | Status: DC | PRN

## 2015-05-03 MED ORDER — LORAZEPAM 1 MG PO TABS: 0.0000 mg | ORAL_TABLET | Freq: Two times a day (BID) | ORAL | Status: DC

## 2015-05-03 MED ORDER — ONDANSETRON HCL 4 MG PO TABS: 4.0000 mg | ORAL_TABLET | Freq: Four times a day (QID) | ORAL | Status: DC | PRN

## 2015-05-03 MED ORDER — HYDRALAZINE HCL 20 MG/ML IJ SOLN: 10.0000 mg | INTRAMUSCULAR | Status: DC | PRN

## 2015-05-03 MED ORDER — ADULT MULTIVITAMIN W/MINERALS CH
1.0000 | ORAL_TABLET | Freq: Every day | ORAL | Status: DC
  Administered 2015-05-03 – 2015-05-05 (×3): 1 via ORAL
  Filled 2015-05-03 (×3): qty 1

## 2015-05-03 MED ORDER — CARVEDILOL 6.25 MG PO TABS
6.2500 mg | ORAL_TABLET | Freq: Two times a day (BID) | ORAL | Status: DC
  Administered 2015-05-03 – 2015-05-05 (×5): 6.25 mg via ORAL
  Filled 2015-05-03 (×5): qty 1

## 2015-05-03 NOTE — Progress Notes (Signed)
Patient admitted after midnight, please see H&P.  D dimer elevated-- CTA pending Suspect labs related to alcohol Cholesterol VERY high-- known issue-- was on medication in past High ferritin--- ? Related to alcoholic liver disease -may need GI consult once CTA back if no PE for further liver studies- hepatic steatosi  Marlin Canary

## 2015-05-03 NOTE — Progress Notes (Signed)
Utilization review completed.  

## 2015-05-03 NOTE — Progress Notes (Signed)
Received report on patient from Nadine Counts, RN from ED. Awaiting pt to be transferred to 5W.

## 2015-05-03 NOTE — Progress Notes (Signed)
Attempted to get report on patient. Windell Moulding, RN from ED is to call back.

## 2015-05-03 NOTE — H&P (Signed)
[facility] History and Physical  Dylan Summers:[id number] DOB: Jun 20, 1965 DOA: 05/02/2015  Referring physician: Dr. Bebe Shaggy. PCP: No PCP Per Patient  Specialists: None.  Chief Complaint: Shortness of breath.  HPI: HARPER SMOKER is a 50 y.o. male with history of alcoholism and hypertension and hypertriglyceridemia who has not been taking his medications for last few months presents to the ER because of shortness of breath. Patient states over the last 2 weeks patient has been having exertional shortness of breath. Denies any chest pain productive cough fever or chills. Chest x-ray did not show anything acute. Patient's labs reveal sodium of 122. Also showed metabolic acidosis with elevated alcohol levels. Patient has had a cardiac cath in 2015 which was unremarkable. Patient at rest is not short of breath. Patient has been admitted for further management.   Review of Systems: As presented in the history of presenting illness, rest negative.  Past Medical History  Diagnosis Date  . Arthritis     "left groin & right foot pains only when weather gets cold" (05/31/2013)  . Chest pain     a. 05/2013 Ant STEMI called but cath showed nl cors and EF 55-60%, Trop neg, D-dimer elevated->CTA  . Steatosis, liver 06/02/2013  . Hyperlipidemia   . HTN (hypertension)    Past Surgical History  Procedure Laterality Date  . Cardiac catheterization  05/31/2013    Normal but tortuous coronary arteries suggestive of hypertensive changes  . Left heart catheterization with coronary angiogram N/A 05/31/2013    Procedure: LEFT HEART CATHETERIZATION WITH CORONARY ANGIOGRAM;  Surgeon: Iran Ouch, MD;  Location: [facility] CATH [facility];  Service: Cardiovascular;  Laterality: N/A;   Social History:  reports that he has quit smoking. His smoking use included Cigars. He has never used smokeless tobacco. He reports that he drinks alcohol. He reports that he does not use illicit drugs. Where does patient live  home. Can patient participate in ADLs? Yes.  No Known Allergies  Family History:  Family History  Problem Relation Age of Onset  . Coronary artery disease Mother     CABG      Prior to Admission medications   Medication Sig Start Date End Date Taking? Authorizing Provider  carvedilol (COREG) 25 MG tablet Take 1/2 tablet twice a day 01/05/14  Yes Iran Ouch, MD  Omega-3 Fatty Acids (FISH OIL) 1200 MG CAPS Take 3 capsules (3,600 mg total) by mouth daily. 06/20/13  Yes Iran Ouch, MD  Potassium Chloride ER 20 MEQ TBCR Take 20 mEq by mouth daily. Patient not taking: Reported on 05/02/2015 01/15/14   Iran Ouch, MD    Physical Exam: Filed Vitals:   05/03/15 0015 05/03/15 0030 05/03/15 0054 05/03/15 0100  BP:   154/94   Pulse: 103 99 102   Temp:   98.1 F (36.7 C)   TempSrc:   Oral   Resp: Height:     (1.702 m)  Weight:    80.5 kg (177 lb 7.5 oz)  SpO2: 97% 96% 98%      General:  Moderately built and nourished.  Eyes: Anicteric no pallor.  ENT: No discharge from the ears eyes nose normal.  Neck: No mass felt. No JVD appreciated.  Cardiovascular: S1 and S2 heard.  Respiratory: No rhonchi or crepitations.  Abdomen: Soft nontender bowel sounds present.  Skin: No rash.  Musculoskeletal: No edema.  Psychiatric: Appears normal.  Neurologic: Alert awake oriented to time place  and person. Moves all extremities.  Labs on Admission:  Basic Metabolic Panel:  Recent Labs Lab 05/02/15 1955  NA 122*  K 5.1  CL 85*  CO2 17*  GLUCOSE 64*  BUN 8  CREATININE 0.71  CALCIUM 8.2*   Liver Function Tests:  Recent Labs Lab 05/02/15 1955  AST 337*  ALT 97*  ALKPHOS 322*  BILITOT 9.4*  PROT 6.0*  ALBUMIN 3.0*   No results for input(s): LIPASE, AMYLASE in the last 168 hours. No results for input(s): AMMONIA in the last 168 hours. CBC:  Recent Labs Lab 05/02/15 1955  WBC 7.5  NEUTROABS 4.4  HGB 10.7*  HCT 29.8*  MCV 101.7*   PLT 133*   Cardiac Enzymes: No results for input(s): CKTOTAL, CKMB, CKMBINDEX, TROPONINI in the last 168 hours.  BNP (last 3 results) No results for input(s): BNP in the last 8760 hours.  ProBNP (last 3 results) No results for input(s): PROBNP in the last 8760 hours.  CBG:  Recent Labs Lab 05/02/15 1941 05/02/15 2009 05/02/15 2134  GLUCAP 64* 65 107*    Radiological Exams on Admission: Dg Chest 2 View  05/02/2015  CLINICAL DATA:  Shortness of breath intermittently x 3 weeks. States it gets worse with exertion. EXAM: CHEST  2 VIEW COMPARISON:  06/01/2013 FINDINGS: The heart size and mediastinal contours are within normal limits. Both lungs are clear. The visualized skeletal structures are unremarkable. IMPRESSION: No active cardiopulmonary disease. Electronically Signed   By: Elige Ko   On: 05/02/2015 16:00    EKG: Independently reviewed. Sinus tachycardia.  Assessment/Plan Principal Problem:   Exertional dyspnea Active Problems:   HTN (hypertension)   Alcoholic hepatitis   Hyponatremia   Macrocytic anemia   Thrombocytopenia (HCC)   Alcohol abuse   1. Exertional shortness of breath - cause not clear. Chest x-ray does not show anything acute. I have ordered a d-dimer BNP and troponin levels. Check 2-D echo. Patient has had a normal cardiac cath in 2015. 2. Hyponatremia probably from alcohol abuse - urine studies revealed possible alcohol-related hyponatremia. At this time will place patient on fluid restriction and positive for metabolic panel. 3. Metabolic acidosis - patient received 2 L normal saline bolus. Restrict further fluids due to hyponatremia. Check lactic acid levels. Metabolic acidosis could be also from alcoholism. Salicylate levels are pending. 4. Alcoholic hepatitis/elevated LFTs - elevated LFTs probably from alcohol hepatitis. Positive follow LFTs. Patient may need to be on prednisone but that is no of any infection source. Need to discuss with GI in  a.m. 5. Alcohol abuse - patient has been placed on CIWA protocol with Ativan and thiamine. 6. Macrocytic anemia - check anemia panel. Follow CBC. 7. Thrombocytopenia probably from alcoholism and possible cirrhosis - follow CBC. 8. History of hypertension - patient was started on Coreg at the urgent care center today. I have placed patient on when necessary IV hydralazine. Closely follow blood pressure trends.   DVT Prophylaxis Lovenox.  Code Status: Full code.  Family Communication: Discussed with patient.  Disposition Plan: Admit to inpatient.    KAKRAKANDY,ARSHAD N. [facility] Pager [phone].  If 7PM-7AM, please contact night-coverage [email or web address] Password [email or web address] 05/03/2015, 2:43 AM

## 2015-05-03 NOTE — Progress Notes (Signed)
  Echocardiogram 2D Echocardiogram has been performed.  Delcie Roch 05/03/2015, 3:35 PM

## 2015-05-03 NOTE — Progress Notes (Signed)
[facility] admissions paged for Admitting MD to see pt.

## 2015-05-03 NOTE — Progress Notes (Signed)
NURSING PROGRESS NOTE  Dylan Summers  MRN: [id number] Admission Data: 05/03/2015 12:56 AM Attending Provider: Eduard Clos, MD  PCP: No PCP Per Patient  Code status: FULL  Allergies: No Known Allergies   Past Medical History:  has a past medical history of Arthritis; Chest pain; Steatosis, liver (06/02/2013); Hyperlipidemia; and HTN (hypertension).   Past Surgical History:  has past surgical history that includes Cardiac catheterization (05/31/2013) and left heart catheterization with coronary angiogram (N/A, 05/31/2013).   Dylan Summers is a 50 y.o.  male patient, arrived to floor in room 5W20 via stretcher, transferred from ED. Patient alert and oriented X 4. No acute distress noted. Denies pain.   Vital signs: Oral temperature 98.1 F (36.7 C), Blood pressure 154/94, Pulse 102, RR 18, SpO2 98 % on room air. Height 5'7" (170.2 cm), weight 177 lbs (80.5 kg).   Cardiac monitoring: Telemetry box 5W # 19 in place.  IV access: Right antecubital; condition patent and no redness.  Skin: intact, no pressure ulcer noted in sacral area.   Patient's ID armband verified with patient/ family, and in place. Information packet given to patient/ family. Fall risk assessed, SR up X2, patient/ family able to verbalize understanding of risks associated with falls and to call nurse or staff to assist before getting out of bed. Patient/ family oriented to room and equipment. Call bell within reach.

## 2015-05-04 DIAGNOSIS — E871 Hypo-osmolality and hyponatremia: Secondary | ICD-10-CM

## 2015-05-04 DIAGNOSIS — F1010 Alcohol abuse, uncomplicated: Secondary | ICD-10-CM

## 2015-05-04 DIAGNOSIS — K7010 Alcoholic hepatitis without ascites: Secondary | ICD-10-CM

## 2015-05-04 DIAGNOSIS — D539 Nutritional anemia, unspecified: Secondary | ICD-10-CM

## 2015-05-04 DIAGNOSIS — R0609 Other forms of dyspnea: Secondary | ICD-10-CM

## 2015-05-04 DIAGNOSIS — D696 Thrombocytopenia, unspecified: Secondary | ICD-10-CM

## 2015-05-04 LAB — COMPREHENSIVE METABOLIC PANEL
ALT: 68 U/L — ABNORMAL HIGH (ref 17–63)
AST: 201 U/L — ABNORMAL HIGH (ref 15–41)
Albumin: 2.3 g/dL — ABNORMAL LOW (ref 3.5–5.0)
Alkaline Phosphatase: 281 U/L — ABNORMAL HIGH (ref 38–126)
Anion gap: 9 (ref 5–15)
BUN: <5 mg/dL — ABNORMAL LOW (ref 6–20)
CHLORIDE: 95 mmol/L — AB (ref 101–111)
CO2: 26 mmol/L (ref 22–32)
CREATININE: 0.67 mg/dL (ref 0.61–1.24)
Calcium: 8.4 mg/dL — ABNORMAL LOW (ref 8.9–10.3)
Glucose, Bld: 116 mg/dL — ABNORMAL HIGH (ref 65–99)
POTASSIUM: 3.1 mmol/L — AB (ref 3.5–5.1)
SODIUM: 130 mmol/L — AB (ref 135–145)
Total Bilirubin: 8 mg/dL — ABNORMAL HIGH (ref 0.3–1.2)
Total Protein: 5.4 g/dL — ABNORMAL LOW (ref 6.5–8.1)

## 2015-05-04 LAB — HEPATITIS PANEL, ACUTE
HCV Ab: 0.1 s/co ratio (ref 0.0–0.9)
HEP A IGM: NEGATIVE
HEP B S AG: NEGATIVE
Hep B C IgM: NEGATIVE

## 2015-05-04 LAB — GLUCOSE, CAPILLARY
GLUCOSE-CAPILLARY: 127 mg/dL — AB (ref 65–99)
GLUCOSE-CAPILLARY: 100 mg/dL — AB (ref 65–99)
GLUCOSE-CAPILLARY: 82 mg/dL (ref 65–99)
Glucose-Capillary: 122 mg/dL — ABNORMAL HIGH (ref 65–99)
Glucose-Capillary: 94 mg/dL (ref 65–99)
Glucose-Capillary: 87 mg/dL (ref 65–99)

## 2015-05-04 LAB — CBC
HEMATOCRIT: 28.8 % — AB (ref 39.0–52.0)
Hemoglobin: 9.5 g/dL — ABNORMAL LOW (ref 13.0–17.0)
MCH: 33.7 pg (ref 26.0–34.0)
MCHC: 33 g/dL (ref 30.0–36.0)
MCV: 102.1 fL — AB (ref 78.0–100.0)
PLATELETS: 88 10*3/uL — AB (ref 150–400)
RBC: 2.82 MIL/uL — AB (ref 4.22–5.81)
RDW: 15.9 % — ABNORMAL HIGH (ref 11.5–15.5)
WBC: 6.8 10*3/uL (ref 4.0–10.5)

## 2015-05-04 MED ORDER — DEXTROSE 5 % IV SOLN
INTRAVENOUS | Status: DC
  Administered 2015-05-04 – 2015-05-05 (×2): via INTRAVENOUS

## 2015-05-04 MED ORDER — SODIUM CHLORIDE 0.9 % IV SOLN
8.0000 [IU]/h | INTRAVENOUS | Status: DC
  Administered 2015-05-04: 8 [IU]/h via INTRAVENOUS
  Filled 2015-05-04 (×2): qty 2.5

## 2015-05-04 MED ORDER — MAGNESIUM SULFATE 2 GM/50ML IV SOLN
2.0000 g | Freq: Once | INTRAVENOUS | Status: AC
  Administered 2015-05-04: 2 g via INTRAVENOUS
  Filled 2015-05-04: qty 50

## 2015-05-04 MED ORDER — POTASSIUM CHLORIDE CRYS ER 20 MEQ PO TBCR
40.0000 meq | EXTENDED_RELEASE_TABLET | Freq: Four times a day (QID) | ORAL | Status: AC
  Administered 2015-05-04 (×2): 40 meq via ORAL
  Filled 2015-05-04 (×2): qty 2

## 2015-05-04 NOTE — Progress Notes (Signed)
[facility] PROGRESS NOTE   EVAAN READINGER OPF:[id number] DOB: [birth year] DOA: 05/02/2015 PCP: No PCP Per Patient  HPI/Subjective: Feels okay, denies any complaint of shortness of breath. Multiple labs abnormalities.  Assessment/Plan: Principal Problem:   Exertional dyspnea Active Problems:   HTN (hypertension)   Alcoholic hepatitis   Hyponatremia   Macrocytic anemia   Thrombocytopenia (HCC)   Alcohol abuse   Exertional dyspnea Unclear to me why he was having exertional dyspnea, given fluids and he is okay. Chest x-ray is negative, CT angiography of the chest showed no PE or active problems. 2-D echo showed normal wall motion, has normal LVEF with grade 1 diastolic dysfunction. This is resolved.  Hypertriglyceridemia Total cholesterol is 988 and triglycerides 1782, will start IV insulin therapy in the hospital. Check triglyceride in the morning. He'll be discharged on fenofibrate.  Elevated LFTs The LFTs pattern consistent with alcohol, AST is 200 and ALT is 68 and earlier today. Patient denies any abdominal pain to suggest alcoholic hepatitis. He probably has some chronic damage to his liver and early cirrhosis.  Elevated ferritin Elevated iron of 186, ferritin is 4800, rule out hemochromatosis, check C282Y PCR Needs to follow-up with GI as outpatient.  Thrombocytopenia Probably part of chronic liver disease.  Macrocytic anemia Hemoglobin around 9.5 MCG 102.1, and is likely secondary to liver disease. Normal folate and B12, check TSH.  Alcohol abuse Patient drink 3 shots of vodka per day, watch for alcohol withdrawal. Patient reported he never had alcohol withdrawal before.  Hyponatremia Likely secondary to liver disease.  Hypokalemia Replete with oral supplements.  Code Status: Full Code Family Communication: Plan discussed with the patient. Disposition Plan: Remains inpatient Diet: Diet Heart Room service appropriate?: Yes; Fluid consistency::  Thin  Consultants:  None  Procedures:  None  Antibiotics:  None (indicate start date, and stop date if known)   Objective: Filed Vitals:   05/04/15 0845 05/04/15 0845  BP: 120/83 120/83  Pulse:  106  Temp:    Resp:      Intake/Output Summary (Last 24 hours) at 05/04/15 1203 Last data filed at 05/04/15 1019  Gross per 24 hour  Intake   1440 ml  Output    400 ml  Net   1040 ml   Filed Weights   05/02/15 1533 05/03/15 0100  Weight: 79.379 kg (175 lb) 80.5 kg (177 lb 7.5 oz)    Exam: General: Alert and awake, oriented x3, not in any acute distress. HEENT: anicteric sclera, pupils reactive to light and accommodation, EOMI CVS: S1-S2 clear, no murmur rubs or gallops Chest: clear to auscultation bilaterally, no wheezing, rales or rhonchi Abdomen: soft nontender, nondistended, normal bowel sounds, no organomegaly Extremities: no cyanosis, clubbing or edema noted bilaterally Neuro: Cranial nerves II-XII intact, no focal neurological deficits  Data Reviewed: Basic Metabolic Panel:  Recent Labs Lab 05/02/15 1955 05/03/15 0444 05/03/15 0730 05/03/15 1213 05/04/15 0655  NA 122* 126* 128* 128* 130*  K 5.1 3.3* 3.2* 3.1* 3.1*  CL 85* 93* 94* 92* 95*  CO2 17* 20* 22 21* 26  GLUCOSE 64* 117* 113* 123* 116*  BUN 8 <5* <5* <5* <5*  CREATININE 0.71 0.69 0.66 0.57* 0.67  CALCIUM 8.2* 7.9* 8.0* 8.5* 8.4*   Liver Function Tests:  Recent Labs Lab 05/02/15 1955 05/03/15 0730 05/04/15 0655  AST 337* 254* 201*  ALT 97* 76* 68*  ALKPHOS 322* 274* 281*  BILITOT 9.4* 8.2* 8.0*  PROT 6.0* 5.3* 5.4*  ALBUMIN 3.0* 2.5* 2.3*  No results for input(s): LIPASE, AMYLASE in the last 168 hours. No results for input(s): AMMONIA in the last 168 hours. CBC:  Recent Labs Lab 05/02/15 1955 05/03/15 0444 05/03/15 0730 05/04/15 0655  WBC 7.5 6.0 6.0 6.8  NEUTROABS 4.4  --  3.6  --   HGB 10.7* 9.4* 9.5* 9.5*  HCT 29.8* 27.8* 28.5* 28.8*  MCV 101.7* 101.5* 101.8* 102.1*    PLT 133* 98* 94* 88*   Cardiac Enzymes:  Recent Labs Lab 05/03/15 0444  TROPONINI 0.03   BNP (last 3 results)  Recent Labs  05/03/15 0444  BNP 83.8    ProBNP (last 3 results) No results for input(s): PROBNP in the last 8760 hours.  CBG:  Recent Labs Lab 05/02/15 1941 05/02/15 2009 05/02/15 2134  GLUCAP 64* 65 107*    Micro No results found for this or any previous visit (from the past 240 hour(s)).   Studies: Dg Chest 2 View  05/02/2015  CLINICAL DATA:  Shortness of breath intermittently x 3 weeks. States it gets worse with exertion. EXAM: CHEST  2 VIEW COMPARISON:  06/01/2013 FINDINGS: The heart size and mediastinal contours are within normal limits. Both lungs are clear. The visualized skeletal structures are unremarkable. IMPRESSION: No active cardiopulmonary disease. Electronically Signed   By: Elige Ko   On: 05/02/2015 16:00   Ct Angio Chest Pe W/cm &/or Wo Cm  05/03/2015  CLINICAL DATA:  Shortness of breath. EXAM: CT ANGIOGRAPHY CHEST WITH CONTRAST TECHNIQUE: Multidetector CT imaging of the chest was performed using the standard protocol during bolus administration of intravenous contrast. Multiplanar CT image reconstructions and MIPs were obtained to evaluate the vascular anatomy. CONTRAST:  OMNIPAQUE IOHEXOL 350 MG/ML SOLN COMPARISON:  Chest x-ray dated 05/02/2015 and CT angiogram dated 06/01/2013 FINDINGS: There are no pulmonary emboli, infiltrates, effusions, or other significant abnormalities. Heart size is normal. No hilar or mediastinal adenopathy. Osseous structures are normal. The patient has hepatomegaly with diffuse hepatic steatosis. Review of the MIP images confirms the above findings. IMPRESSION: Normal CT angiogram of the chest.  No pulmonary emboli. Increased hepatomegaly with diffuse hepatic steatosis. Electronically Signed   By: Francene Boyers M.D.   On: 05/03/2015 13:39    Scheduled Meds: . carvedilol  6.25 mg Oral BID WC  . enoxaparin  (LOVENOX) injection  40 mg Subcutaneous Daily  . folic acid  1 mg Oral Daily  . LORazepam  0-4 mg Oral 4 times per day   Followed by  . [START ON 05/05/2015] LORazepam  0-4 mg Oral Q12H  . magnesium sulfate 1 - 4 g bolus IVPB  2 g Intravenous Once  . multivitamin with minerals  1 tablet Oral Daily  . omega-3 acid ethyl esters  1 g Oral Daily  . potassium chloride  40 mEq Oral Q6H  . sodium chloride  3 mL Intravenous Q12H  . thiamine  100 mg Oral Daily   Continuous Infusions:      Time spent: 35 minutes    [facility] A  [facility] Pager [phone] If 7PM-7AM, please contact night-coverage at [email or web address], password [facility] 05/04/2015, 12:03 PM  LOS: 2 days

## 2015-05-05 DIAGNOSIS — I10 Essential (primary) hypertension: Secondary | ICD-10-CM

## 2015-05-05 LAB — COMPREHENSIVE METABOLIC PANEL
ALBUMIN: 2.4 g/dL — AB (ref 3.5–5.0)
ALT: 62 U/L (ref 17–63)
AST: 159 U/L — AB (ref 15–41)
Alkaline Phosphatase: 302 U/L — ABNORMAL HIGH (ref 38–126)
Anion gap: 7 (ref 5–15)
CHLORIDE: 96 mmol/L — AB (ref 101–111)
CO2: 26 mmol/L (ref 22–32)
CREATININE: 0.68 mg/dL (ref 0.61–1.24)
Calcium: 8.6 mg/dL — ABNORMAL LOW (ref 8.9–10.3)
GFR calc Af Amer: >60 mL/min (ref >60)
Glucose, Bld: 80 mg/dL (ref 65–99)
POTASSIUM: 3.6 mmol/L (ref 3.5–5.1)
SODIUM: 129 mmol/L — AB (ref 135–145)
Total Bilirubin: 7.6 mg/dL — ABNORMAL HIGH (ref 0.3–1.2)
Total Protein: 5.9 g/dL — ABNORMAL LOW (ref 6.5–8.1)

## 2015-05-05 LAB — GLUCOSE, CAPILLARY
GLUCOSE-CAPILLARY: 91 mg/dL (ref 65–99)
GLUCOSE-CAPILLARY: 123 mg/dL — AB (ref 65–99)
Glucose-Capillary: 108 mg/dL — ABNORMAL HIGH (ref 65–99)
Glucose-Capillary: 118 mg/dL — ABNORMAL HIGH (ref 65–99)
Glucose-Capillary: 78 mg/dL (ref 65–99)
Glucose-Capillary: 79 mg/dL (ref 65–99)
Glucose-Capillary: 83 mg/dL (ref 65–99)

## 2015-05-05 LAB — LIPID PANEL
CHOL/HDL RATIO: 49.9 ratio
CHOLESTEROL: 898 mg/dL — AB (ref 0–200)
HDL: 18 mg/dL — AB (ref >40)
LDL Cholesterol: 811 mg/dL — ABNORMAL HIGH (ref 0–99)
Triglycerides: 344 mg/dL — ABNORMAL HIGH (ref <150)
VLDL: 69 mg/dL — AB (ref 0–40)

## 2015-05-05 LAB — MAGNESIUM: MAGNESIUM: 1.9 mg/dL (ref 1.7–2.4)

## 2015-05-05 MED ORDER — CARVEDILOL 6.25 MG PO TABS: ORAL_TABLET | ORAL | Status: DC

## 2015-05-05 MED ORDER — ROSUVASTATIN CALCIUM 10 MG PO TABS: 10.0000 mg | ORAL_TABLET | Freq: Every day | ORAL | Status: DC

## 2015-05-05 MED ORDER — FENOFIBRATE 145 MG PO TABS: 145.0000 mg | ORAL_TABLET | Freq: Every day | ORAL | Status: DC

## 2015-05-05 NOTE — Progress Notes (Signed)
Pt CBG 79 md on call gave orange with sugar and gram cracker since he is getting insulin drip for high cholesterol [place] was informed said i should encourage him to eat will continue to monitor

## 2015-05-05 NOTE — Progress Notes (Signed)
05/05/15 Patient to be discharged today, IV sites removed, and discharged instructions discussed with patient.

## 2015-05-05 NOTE — Discharge Summary (Addendum)
Physician Discharge Summary  Dylan Summers ZOX:[id number] DOB: Jun 11, 1965 DOA: 05/02/2015  PCP: No PCP Per Patient  Admit date: 05/02/2015 Discharge date: 05/05/2015  Time spent: 40 minutes  Recommendations for Outpatient Follow-up:  1. Follow up with gastroenterology in 1 week. 2. Follow up on liver tests including hemochromatosis gene.   Discharge Diagnoses:  Principal Problem:   Exertional dyspnea Active Problems:   HTN (hypertension)   Alcoholic hepatitis   Hyponatremia   Macrocytic anemia   Thrombocytopenia (HCC)   Alcohol abuse   Discharge Condition: Stable  Diet recommendation: Heart healthy  Filed Weights   05/02/15 1533 05/03/15 0100  Weight: 79.379 kg (175 lb) 80.5 kg (177 lb 7.5 oz)    History of present illness:  Dylan Summers is a 50 y.o. male with history of alcoholism and hypertension and hypertriglyceridemia who has not been taking his medications for last few months presents to the ER because of shortness of breath. Patient states over the last 2 weeks patient has been having exertional shortness of breath. Denies any chest pain productive cough fever or chills. Chest x-ray did not show anything acute. Patient's labs reveal sodium of 122. Also showed metabolic acidosis with elevated alcohol levels. Patient has had a cardiac cath in 2015 which was unremarkable. Patient at rest is not short of breath. Patient has been admitted for further management.   Hospital Course:   Exertional dyspnea Unclear to me why he was having exertional dyspnea, resolved after IVF. Chest x-ray is negative, CT angiography of the chest showed no PE or active problems. 2-D echo showed normal wall motion, has normal LVEF with grade 1 diastolic dysfunction. This is resolved.  Hypertriglyceridemia Total cholesterol is 988 and triglycerides 1782, treated with IV insulin therapy in the hospital. Triglycerides decreased from 1782 down to 344 after insulin therapy, discharged on  TriCor and rosuvastatin.  Elevated LFTs The LFTs pattern consistent with alcohol, AST is 200 and ALT is 68 and earlier today, Na is 130, albumin is 2.4 and INR is 1.3. Patient denies any abdominal pain to suggest alcoholic hepatitis. He probably has some chronic damage to his liver and early cirrhosis. Patient ferritin is 4800, patient to follow-up with gastroenterology as outpatient.  Elevated ferritin Elevated iron of 186, ferritin is 4800, rule out hemochromatosis, C282Y PCR pending at the time of discharge.  Instructed to follow-up with Dr. Dulce Sellar as out patient, he saw him in the hospital in 05/2013  Thrombocytopenia Probably part of chronic liver disease.  Macrocytic anemia Hemoglobin around 9.5 MCG 102.1, and is likely secondary to liver disease. Normal folate and B12.  Alcohol abuse Patient drink 3 shots of vodka per day,  no symptoms or signs of alcohol withdrawal. Patient reported he never had alcohol withdrawal before.  Hyponatremia Likely secondary to liver disease.  Hypokalemia Repleted with oral supplements.   Procedures: None  Consultations:  none  Discharge Exam: Filed Vitals:   05/05/15 0019 05/05/15 0602  BP: 119/69 104/80  Pulse: 100 106  Temp: 98.3 F (36.8 C) 99.4 F (37.4 C)  Resp: 18 18   General: Alert and awake, oriented x3, not in any acute distress. HEENT: anicteric sclera, pupils reactive to light and accommodation, EOMI CVS: S1-S2 clear, no murmur rubs or gallops Chest: clear to auscultation bilaterally, no wheezing, rales or rhonchi Abdomen: soft nontender, nondistended, normal bowel sounds, no organomegaly Extremities: no cyanosis, clubbing or edema noted bilaterally Neuro: Cranial nerves II-XII intact, no focal neurological deficits  Discharge Instructions  Discharge Instructions    Diet - low sodium heart healthy    Complete by:  As directed      Increase activity slowly    Complete by:  As directed           Current  Discharge Medication List    START taking these medications   Details  fenofibrate (TRICOR) 145 MG tablet Take 1 tablet (145 mg total) by mouth daily. Qty: 30 tablet, Refills: 0    rosuvastatin (CRESTOR) 10 MG tablet Take 1 tablet (10 mg total) by mouth daily. Qty: 30 tablet, Refills: 0      CONTINUE these medications which have CHANGED   Details  carvedilol (COREG) 6.25 MG tablet Take 1/2 tablet twice a day Qty: 60 tablet, Refills: 0   Associated Diagnoses: Hyperlipidemia      CONTINUE these medications which have NOT CHANGED   Details  Omega-3 Fatty Acids (FISH OIL) 1200 MG CAPS Take 3 capsules (3,600 mg total) by mouth daily.   Associated Diagnoses: Essential hypertension      STOP taking these medications     Potassium Chloride ER 20 MEQ TBCR        No Known Allergies Follow-up Information    Follow up with Summers,Dylan D, MD. Schedule an appointment as soon as possible for a visit in 1 week.   Specialty:  Gastroenterology   Contact information:   [address] [place] [address] [phone]        The results of significant diagnostics from this hospitalization (including imaging, microbiology, ancillary and laboratory) are listed below for reference.    Significant Diagnostic Studies: Dg Chest 2 View  05/02/2015  CLINICAL DATA:  Shortness of breath intermittently x 3 weeks. States it gets worse with exertion. EXAM: CHEST  2 VIEW COMPARISON:  06/01/2013 FINDINGS: The heart size and mediastinal contours are within normal limits. Both lungs are clear. The visualized skeletal structures are unremarkable. IMPRESSION: No active cardiopulmonary disease. Electronically Signed   By: Elige Ko   On: 05/02/2015 16:00   Ct Angio Chest Pe W/cm &/or Wo Cm  05/03/2015  CLINICAL DATA:  Shortness of breath. EXAM: CT ANGIOGRAPHY CHEST WITH CONTRAST TECHNIQUE: Multidetector CT imaging of the chest was performed using the standard protocol during bolus  administration of intravenous contrast. Multiplanar CT image reconstructions and MIPs were obtained to evaluate the vascular anatomy. CONTRAST:  OMNIPAQUE IOHEXOL 350 MG/ML SOLN COMPARISON:  Chest x-ray dated 05/02/2015 and CT angiogram dated 06/01/2013 FINDINGS: There are no pulmonary emboli, infiltrates, effusions, or other significant abnormalities. Heart size is normal. No hilar or mediastinal adenopathy. Osseous structures are normal. The patient has hepatomegaly with diffuse hepatic steatosis. Review of the MIP images confirms the above findings. IMPRESSION: Normal CT angiogram of the chest.  No pulmonary emboli. Increased hepatomegaly with diffuse hepatic steatosis. Electronically Signed   By: Francene Boyers M.D.   On: 05/03/2015 13:39    Microbiology: No results found for this or any previous visit (from the past 240 hour(s)).   Labs: Basic Metabolic Panel:  Recent Labs Lab 05/03/15 0444 05/03/15 0730 05/03/15 1213 05/04/15 0655 05/05/15 0627  NA 126* 128* 128* 130* 129*  K 3.3* 3.2* 3.1* 3.1* 3.6  CL 93* 94* 92* 95* 96*  CO2 20* 22 21* 26 26  GLUCOSE 117* 113* 123* 116* 80  BUN <5* <5* <5* <5* <5*  CREATININE 0.69 0.66 0.57* 0.67 0.68  CALCIUM 7.9* 8.0* 8.5* 8.4* 8.6*  MG  --   --   --   --  1.9   Liver Function Tests:  Recent Labs Lab 05/02/15 1955 05/03/15 0730 05/04/15 0655 05/05/15 0627  AST 337* 254* 201* 159*  ALT 97* 76* 68* 62  ALKPHOS 322* 274* 281* 302*  BILITOT 9.4* 8.2* 8.0* 7.6*  PROT 6.0* 5.3* 5.4* 5.9*  ALBUMIN 3.0* 2.5* 2.3* 2.4*   No results for input(s): LIPASE, AMYLASE in the last 168 hours. No results for input(s): AMMONIA in the last 168 hours. CBC:  Recent Labs Lab 05/02/15 1955 05/03/15 0444 05/03/15 0730 05/04/15 0655  WBC 7.5 6.0 6.0 6.8  NEUTROABS 4.4  --  3.6  --   HGB 10.7* 9.4* 9.5* 9.5*  HCT 29.8* 27.8* 28.5* 28.8*  MCV 101.7* 101.5* 101.8* 102.1*  PLT 133* 98* 94* 88*   Cardiac Enzymes:  Recent Labs Lab  05/03/15 0444  TROPONINI 0.03   BNP: BNP (last 3 results)  Recent Labs  05/03/15 0444  BNP 83.8    ProBNP (last 3 results) No results for input(s): PROBNP in the last 8760 hours.  CBG:  Recent Labs Lab 05/05/15 0237 05/05/15 0456 05/05/15 0606 05/05/15 0608 05/05/15 0815  GLUCAP 91 123* 83 79 78       Signed:  [place] Kuang A MD.  [facility] 05/05/2015, 11:16 AM

## 2015-05-07 LAB — AFP TUMOR MARKER: AFP TUMOR MARKER: 7.2 ng/mL (ref 0.0–8.3)

## 2015-05-10 LAB — HEMOCHROMATOSIS DNA-PCR(C282Y,H63D)

## 2015-10-30 ENCOUNTER — Encounter (HOSPITAL_COMMUNITY): Payer: Self-pay | Admitting: Emergency Medicine

## 2015-10-30 ENCOUNTER — Emergency Department (HOSPITAL_COMMUNITY): Payer: BLUE CROSS/BLUE SHIELD

## 2015-10-30 ENCOUNTER — Emergency Department (HOSPITAL_COMMUNITY)
Admission: EM | Admit: 2015-10-30 | Discharge: 2015-10-30 | Disposition: A | Discharge status: Home / Self Care | Payer: BLUE CROSS/BLUE SHIELD | Attending: Emergency Medicine | Admitting: Emergency Medicine

## 2015-10-30 DIAGNOSIS — S0990XA Unspecified injury of head, initial encounter: Secondary | ICD-10-CM | POA: Diagnosis present

## 2015-10-30 DIAGNOSIS — Y999 Unspecified external cause status: Secondary | ICD-10-CM | POA: Diagnosis not present

## 2015-10-30 DIAGNOSIS — Y92009 Unspecified place in unspecified non-institutional (private) residence as the place of occurrence of the external cause: Secondary | ICD-10-CM | POA: Diagnosis not present

## 2015-10-30 DIAGNOSIS — S43015A Anterior dislocation of left humerus, initial encounter: Secondary | ICD-10-CM | POA: Diagnosis not present

## 2015-10-30 DIAGNOSIS — W1800XA Striking against unspecified object with subsequent fall, initial encounter: Secondary | ICD-10-CM | POA: Insufficient documentation

## 2015-10-30 DIAGNOSIS — M19071 Primary osteoarthritis, right ankle and foot: Secondary | ICD-10-CM | POA: Diagnosis not present

## 2015-10-30 DIAGNOSIS — S0083XA Contusion of other part of head, initial encounter: Secondary | ICD-10-CM | POA: Insufficient documentation

## 2015-10-30 DIAGNOSIS — Z79899 Other long term (current) drug therapy: Secondary | ICD-10-CM | POA: Diagnosis not present

## 2015-10-30 DIAGNOSIS — Y939 Activity, unspecified: Secondary | ICD-10-CM | POA: Insufficient documentation

## 2015-10-30 DIAGNOSIS — Z87891 Personal history of nicotine dependence: Secondary | ICD-10-CM | POA: Diagnosis not present

## 2015-10-30 DIAGNOSIS — G5682 Other specified mononeuropathies of left upper limb: Secondary | ICD-10-CM | POA: Insufficient documentation

## 2015-10-30 DIAGNOSIS — G589 Mononeuropathy, unspecified: Secondary | ICD-10-CM

## 2015-10-30 DIAGNOSIS — E785 Hyperlipidemia, unspecified: Secondary | ICD-10-CM | POA: Diagnosis not present

## 2015-10-30 DIAGNOSIS — I10 Essential (primary) hypertension: Secondary | ICD-10-CM | POA: Insufficient documentation

## 2015-10-30 DIAGNOSIS — S43005A Unspecified dislocation of left shoulder joint, initial encounter: Secondary | ICD-10-CM

## 2015-10-30 LAB — BASIC METABOLIC PANEL
Anion gap: 15 (ref 5–15)
BUN: 11 mg/dL (ref 6–20)
CHLORIDE: 93 mmol/L — AB (ref 101–111)
CO2: 21 mmol/L — ABNORMAL LOW (ref 22–32)
Calcium: 9.7 mg/dL (ref 8.9–10.3)
Creatinine, Ser: 0.43 mg/dL — ABNORMAL LOW (ref 0.61–1.24)
GFR calc non Af Amer: >60 mL/min (ref >60)
Glucose, Bld: 101 mg/dL — ABNORMAL HIGH (ref 65–99)
POTASSIUM: 3.5 mmol/L (ref 3.5–5.1)
SODIUM: 129 mmol/L — AB (ref 135–145)

## 2015-10-30 LAB — CBC
HEMATOCRIT: 38.4 % — AB (ref 39.0–52.0)
Hemoglobin: 13.3 g/dL (ref 13.0–17.0)
MCH: 34 pg (ref 26.0–34.0)
MCHC: 34.6 g/dL (ref 30.0–36.0)
MCV: 98.2 fL (ref 78.0–100.0)
Platelets: 236 10*3/uL (ref 150–400)
RBC: 3.91 MIL/uL — AB (ref 4.22–5.81)
RDW: 13.4 % (ref 11.5–15.5)
WBC: 14 10*3/uL — AB (ref 4.0–10.5)

## 2015-10-30 LAB — I-STAT TROPONIN, ED: Troponin i, poc: 0 ng/mL (ref 0.00–0.08)

## 2015-10-30 MED ORDER — HYDROCODONE-ACETAMINOPHEN 5-325 MG PO TABS: 1.0000 | ORAL_TABLET | Freq: Four times a day (QID) | ORAL | Status: DC | PRN

## 2015-10-30 MED ORDER — POTASSIUM CHLORIDE CRYS ER 20 MEQ PO TBCR: 20.0000 meq | EXTENDED_RELEASE_TABLET | Freq: Every day | ORAL | Status: DC

## 2015-10-30 MED ORDER — AMLODIPINE BESYLATE 5 MG PO TABS
5.0000 mg | ORAL_TABLET | Freq: Once | ORAL | Status: AC
  Administered 2015-10-30: 5 mg via ORAL
  Filled 2015-10-30: qty 1

## 2015-10-30 MED ORDER — PROPOFOL 10 MG/ML IV BOLUS
INTRAVENOUS | Status: AC | PRN
  Administered 2015-10-30: 50 mg via INTRAVENOUS

## 2015-10-30 MED ORDER — HYDROMORPHONE HCL 1 MG/ML IJ SOLN
1.0000 mg | Freq: Once | INTRAMUSCULAR | Status: AC
  Administered 2015-10-30: 1 mg via INTRAVENOUS
  Filled 2015-10-30: qty 1

## 2015-10-30 MED ORDER — ONDANSETRON HCL 4 MG/2ML IJ SOLN
4.0000 mg | Freq: Once | INTRAMUSCULAR | Status: AC
  Administered 2015-10-30: 4 mg via INTRAVENOUS
  Filled 2015-10-30: qty 2

## 2015-10-30 MED ORDER — CARVEDILOL 12.5 MG PO TABS
12.5000 mg | ORAL_TABLET | Freq: Two times a day (BID) | ORAL | Status: DC
Start: 2015-10-30 — End: 2017-11-09

## 2015-10-30 MED ORDER — LISINOPRIL-HYDROCHLOROTHIAZIDE 20-25 MG PO TABS: 1.0000 | ORAL_TABLET | Freq: Every day | ORAL | Status: DC

## 2015-10-30 MED ORDER — CARVEDILOL 12.5 MG PO TABS
12.5000 mg | ORAL_TABLET | Freq: Once | ORAL | Status: AC
  Administered 2015-10-30: 12.5 mg via ORAL
  Filled 2015-10-30: qty 1

## 2015-10-30 MED ORDER — SODIUM CHLORIDE 0.9 % IV SOLN
INTRAVENOUS | Status: AC | PRN
  Administered 2015-10-30: 1000 mL via INTRAVENOUS

## 2015-10-30 MED ORDER — PROPOFOL 10 MG/ML IV BOLUS
0.5000 mg/kg | Freq: Once | INTRAVENOUS | Status: AC
  Administered 2015-10-30: 40.8 mg via INTRAVENOUS
  Filled 2015-10-30: qty 20

## 2015-10-30 NOTE — Discharge Instructions (Signed)
It was our pleasure to provide your ER care today - we hope that you feel better.  Take your blood pressure medications as prescribed.  As the lisinopril/hctz can lower your potassium level - take potassium supplement as prescribed.  Follow up with primary care doctor in the coming week for recheck of blood pressure, as it is high today.  For shoulder dislocation (and resultant arm numbness/weakness) follow up with orthopedist in the next few days - see referral - call office tomorrow morning to arrange appointment - discuss therapy/physical therapy then for hand/arm numbness and weakness.   Wear shoulder immobilizer.  Ice/coldpack to sore area.   You may take hydrocodone as need for pain. No driving when taking hydrocodone. Also, do not take tylenol or acetaminophen containing medication when taking hydrocodone.  Return to ER if worse, new symptoms, new or severe pain, other concern.  You were given sedating medications in the ER - no driving for the next 12 hours.        Shoulder Dislocation A shoulder dislocation happens when the upper arm bone (humerus) moves out of the shoulder joint. The shoulder joint is the part of the shoulder where the humerus, shoulder blade (scapula), and collarbone (clavicle) meet. CAUSES This condition is often caused by:  A fall.  A hit to the shoulder.  A forceful movement of the shoulder. RISK FACTORS This condition is more likely to develop in people who play sports. SYMPTOMS Symptoms of this condition include:  Deformity of the shoulder.  Intense pain.  Inability to move the shoulder.  Numbness, weakness, or tingling in your neck or down your arm.  Bruising or swelling around your shoulder. DIAGNOSIS This condition is diagnosed with a physical exam. After the exam, tests may be done to check for related problems. Tests that may be done include:  X-ray. This may be done to check for broken bones.  MRI. This may be done to check for  damage to the tissues around the shoulder.  Electromyogram. This may be done to check for nerve damage. TREATMENT This condition is treated with a procedure to place the humerus back in the joint. This procedure is called a reduction. There are two types of reduction:  Closed reduction. In this procedure, the humerus is placed back in the joint without surgery. The health care provider uses his or her hands to guide the bone back into place.  Open reduction. In this procedure, the humerus is placed back in the joint with surgery. An open reduction may be recommended if:  You have a weak shoulder joint or weak ligaments.  You have had more than one shoulder dislocation.  The nerves or blood vessels around your shoulder have been damaged. After the humerus is placed back into the joint, your arm will be placed in a splint or sling to prevent it from moving. You will need to wear the splint or sling until your shoulder heals. When the splint or sling is removed, you may have physical therapy to help improve the range of motion in your shoulder joint. HOME CARE INSTRUCTIONS If You Have a Splint or Sling:  Wear it as told by your health care provider. Remove it only as told by your health care provider.  Loosen it if your fingers become numb and tingle, or if they turn cold and blue.  Keep it clean and dry. Bathing  Do not take baths, swim, or use a hot tub until your health care provider approves. Ask your  health care provider if you can take showers. You may only be allowed to take sponge baths for bathing.  If your health care provider approves bathing and showering, cover your splint or sling with a watertight plastic bag to protect it from water. Do not let the splint or sling get wet. Managing Pain, Stiffness, and Swelling  If directed, apply ice to the injured area.  Put ice in a plastic bag.  Place a towel between your skin and the bag.  Leave the ice on for 20 minutes, 2-3  times per day.  Move your fingers often to avoid stiffness and to decrease swelling.  Raise (elevate) the injured area above the level of your heart while you are sitting or lying down. Driving  Do not drive while wearing a splint or sling on a hand that you use for driving.  Do not drive or operate heavy machinery while taking pain medicine. Activity  Return to your normal activities as told by your health care provider. Ask your health care provider what activities are safe for you.  Perform range-of-motion exercises only as told by your health care provider.  Exercise your hand by squeezing a soft ball. This helps to decrease stiffness and swelling in your hand and wrist. General Instructions  Take over-the-counter and prescription medicines only as told by your health care provider.  Do not use any tobacco products, including cigarettes, chewing tobacco, or e-cigarettes. Tobacco can delay bone and tissue healing. If you need help quitting, ask your health care provider.  Keep all follow-up visits as told by your health care provider. This is important. SEEK MEDICAL CARE IF:  Your splint or sling gets damaged. SEEK IMMEDIATE MEDICAL CARE IF:  Your pain gets worse rather than better.  You lose feeling in your arm or hand.  Your arm or hand becomes white and cold.   This information is not intended to replace advice given to you by your health care provider. Make sure you discuss any questions you have with your health care provider.   Document Released: 01/06/2001 Document Revised: 01/02/2015 Document Reviewed: 08/06/2014 Elsevier Interactive Patient Education 2016 [employer].    How to Use a Shoulder Immobilizer A shoulder immobilizer is a device that you may have to wear after a shoulder injury or surgery. This device keeps your arm from moving. This prevents additional pain or injury. It also supports your arm next to your body as your shoulder heals. You may need  to wear a shoulder immobilizer to treat a broken bone (fracture) in your shoulder. You may also need to wear one if you have an injury that moves your shoulder out of position (dislocation). There are different types of shoulder immobilizers. The one that you get depends on your injury. RISKS AND COMPLICATIONS Wearing a shoulder immobilizer in the wrong way can let your injured shoulder move around too much. This may delay healing and make your pain and swelling worse. HOW TO USE YOUR SHOULDER IMMOBILIZER  The part of the immobilizer that goes around your neck (sling) should support your upper arm, with your elbow bent and your lower arm and hand across your chest.  Make sure that your elbow:  Is snug against the back pocket of the sling.  Does not move away from your body.  The strap of the immobilizer should go over your shoulder and support your arm and hand. Your hand should be slightly higher than your elbow. It should not hang loosely over the  edge of the sling.  If the long strap has a pad, place it where it is most comfortable on your neck.  Carefully follow your health care provider's instructions for wearing your shoulder immobilizer. Your health care provider may want you to:  Loosen your immobilizer to straighten your elbow and move your wrist and fingers. You may have to do this several times each day. Ask your health care provider when you should do this and how often.  Remove your immobilizer once every day to shower, but limit the movement in your injured arm. Before putting the immobilizer back on, use a towel to dry the area under your arm completely.  Remove your immobilizer to do shoulder exercises at home as directed by your health care provider.  Wear your immobilizer while you sleep. You may sleep more comfortably if you have your upper body raised on pillows. SEEK MEDICAL CARE IF:  Your immobilizer is not supporting your arm properly.  Your immobilizer gets  damaged.  You have worsening pain or swelling in your shoulder, arm, or hand.  Your shoulder, arm, or hand changes color or temperature.  You lose feeling in your shoulder, arm, or hand.   This information is not intended to replace advice given to you by your health care provider. Make sure you discuss any questions you have with your health care provider.   Document Released: 05/21/2004 Document Revised: 08/28/2014 Document Reviewed: 03/21/2014 Elsevier Interactive Patient Education 2016 [employer]   Cryotherapy Cryotherapy means treatment with cold. Ice or gel packs can be used to reduce both pain and swelling. Ice is the most helpful within the first 24 to 48 hours after an injury or flare-up from overusing a muscle or joint. Sprains, strains, spasms, burning pain, shooting pain, and aches can all be eased with ice. Ice can also be used when recovering from surgery. Ice is effective, has very few side effects, and is safe for most people to use. PRECAUTIONS  Ice is not a safe treatment option for people with:  Raynaud phenomenon. This is a condition affecting small blood vessels in the extremities. Exposure to cold may cause your problems to return.  Cold hypersensitivity. There are many forms of cold hypersensitivity, including:  Cold urticaria. Red, itchy hives appear on the skin when the tissues begin to warm after being iced.  Cold erythema. This is a red, itchy rash caused by exposure to cold.  Cold hemoglobinuria. Red blood cells break down when the tissues begin to warm after being iced. The hemoglobin that carry oxygen are passed into the urine because they cannot combine with blood proteins fast enough.  Numbness or altered sensitivity in the area being iced. If you have any of the following conditions, do not use ice until you have discussed cryotherapy with your caregiver:  Heart conditions, such as arrhythmia, angina, or chronic heart disease.  High blood  pressure.  Healing wounds or open skin in the area being iced.  Current infections.  Rheumatoid arthritis.  Poor circulation.  Diabetes. Ice slows the blood flow in the region it is applied. This is beneficial when trying to stop inflamed tissues from spreading irritating chemicals to surrounding tissues. However, if you expose your skin to cold temperatures for too long or without the proper protection, you can damage your skin or nerves. Watch for signs of skin damage due to cold. HOME CARE INSTRUCTIONS Follow these tips to use ice and cold packs safely.  Place a dry or damp towel  between the ice and skin. A damp towel will cool the skin more quickly, so you may need to shorten the time that the ice is used.  For a more rapid response, add gentle compression to the ice.  Ice for no more than 10 to 20 minutes at a time. The bonier the area you are icing, the less time it will take to get the benefits of ice.  Check your skin after 5 minutes to make sure there are no signs of a poor response to cold or skin damage.  Rest 20 minutes or more between uses.  Once your skin is numb, you can end your treatment. You can test numbness by very lightly touching your skin. The touch should be so light that you do not see the skin dimple from the pressure of your fingertip. When using ice, most people will feel these normal sensations in this order: cold, burning, aching, and numbness.  Do not use ice on someone who cannot communicate their responses to pain, such as small children or people with dementia. HOW TO MAKE AN ICE PACK Ice packs are the most common way to use ice therapy. Other methods include ice massage, ice baths, and cryosprays. Muscle creams that cause a cold, tingly feeling do not offer the same benefits that ice offers and should not be used as a substitute unless recommended by your caregiver. To make an ice pack, do one of the following:  Place crushed ice or a bag of frozen  vegetables in a sealable plastic bag. Squeeze out the excess air. Place this bag inside another plastic bag. Slide the bag into a pillowcase or place a damp towel between your skin and the bag.  Mix 3 parts water with 1 part rubbing alcohol. Freeze the mixture in a sealable plastic bag. When you remove the mixture from the freezer, it will be slushy. Squeeze out the excess air. Place this bag inside another plastic bag. Slide the bag into a pillowcase or place a damp towel between your skin and the bag. SEEK MEDICAL CARE IF:  You develop white spots on your skin. This may give the skin a blotchy (mottled) appearance.  Your skin turns blue or pale.  Your skin becomes waxy or hard.  Your swelling gets worse. MAKE SURE YOU:   Understand these instructions.  Will watch your condition.  Will get help right away if you are not doing well or get worse.   This information is not intended to replace advice given to you by your health care provider. Make sure you discuss any questions you have with your health care provider.   Document Released: 12/08/2010 Document Revised: 05/04/2014 Document Reviewed: 12/08/2010 Elsevier Interactive Patient Education 2016 [employer].    Hypertension Hypertension, commonly called high blood pressure, is when the force of blood pumping through your arteries is too strong. Your arteries are the blood vessels that carry blood from your heart throughout your body. A blood pressure reading consists of a higher number over a lower number, such as 110/72. The higher number (systolic) is the pressure inside your arteries when your heart pumps. The lower number (diastolic) is the pressure inside your arteries when your heart relaxes. Ideally you want your blood pressure below 120/80. Hypertension forces your heart to work harder to pump blood. Your arteries may become narrow or stiff. Having untreated or uncontrolled hypertension can cause heart attack, stroke, kidney  disease, and other problems. RISK FACTORS Some risk factors for high  blood pressure are controllable. Others are not.  Risk factors you cannot control include:   Race. You may be at higher risk if you are African American.  Age. Risk increases with age.  Gender. Men are at higher risk than women before age 64 years. After age 76, women are at higher risk than men. Risk factors you can control include:  Not getting enough exercise or physical activity.  Being overweight.  Getting too much fat, sugar, calories, or salt in your diet.  Drinking too much alcohol. SIGNS AND SYMPTOMS Hypertension does not usually cause signs or symptoms. Extremely high blood pressure (hypertensive crisis) may cause headache, anxiety, shortness of breath, and nosebleed. DIAGNOSIS To check if you have hypertension, your health care provider will measure your blood pressure while you are seated, with your arm held at the level of your heart. It should be measured at least twice using the same arm. Certain conditions can cause a difference in blood pressure between your right and left arms. A blood pressure reading that is higher than normal on one occasion does not mean that you need treatment. If it is not clear whether you have high blood pressure, you may be asked to return on a different day to have your blood pressure checked again. Or, you may be asked to monitor your blood pressure at home for 1 or more weeks. TREATMENT Treating high blood pressure includes making lifestyle changes and possibly taking medicine. Living a healthy lifestyle can help lower high blood pressure. You may need to change some of your habits. Lifestyle changes may include:  Following the DASH diet. This diet is high in fruits, vegetables, and whole grains. It is low in salt, red meat, and added sugars.  Keep your sodium intake below 2,300 mg per day.  Getting at least 30-45 minutes of aerobic exercise at least 4 times per  week.  Losing weight if necessary.  Not smoking.  Limiting alcoholic beverages.  Learning ways to reduce stress. Your health care provider may prescribe medicine if lifestyle changes are not enough to get your blood pressure under control, and if one of the following is true:  You are 22-18 years of age and your systolic blood pressure is above 140.  You are 19 years of age or older, and your systolic blood pressure is above 150.  Your diastolic blood pressure is above 90.  You have diabetes, and your systolic blood pressure is over 140 or your diastolic blood pressure is over 90.  You have kidney disease and your blood pressure is above 140/90.  You have heart disease and your blood pressure is above 140/90. Your personal target blood pressure may vary depending on your medical conditions, your age, and other factors. HOME CARE INSTRUCTIONS  Have your blood pressure rechecked as directed by your health care provider.   Take medicines only as directed by your health care provider. Follow the directions carefully. Blood pressure medicines must be taken as prescribed. The medicine does not work as well when you skip doses. Skipping doses also puts you at risk for problems.  Do not smoke.   Monitor your blood pressure at home as directed by your health care provider. SEEK MEDICAL CARE IF:   You think you are having a reaction to medicines taken.  You have recurrent headaches or feel dizzy.  You have swelling in your ankles.  You have trouble with your vision. SEEK IMMEDIATE MEDICAL CARE IF:  You develop a severe headache or  confusion.  You have unusual weakness, numbness, or feel faint.  You have severe chest or abdominal pain.  You vomit repeatedly.  You have trouble breathing. MAKE SURE YOU:   Understand these instructions.  Will watch your condition.  Will get help right away if you are not doing well or get worse.   This information is not intended to  replace advice given to you by your health care provider. Make sure you discuss any questions you have with your health care provider.   Document Released: 04/13/2005 Document Revised: 08/28/2014 Document Reviewed: 02/03/2013 Elsevier Interactive Patient Education [employer]

## 2015-10-30 NOTE — ED Notes (Signed)
He is awake and alert and deftly makes a cell phone call for his ride home.  I plan to allow him to stay in his room until his ride is here.

## 2015-10-30 NOTE — ED Provider Notes (Addendum)
CSN: [id number]     Arrival date & time 10/30/15  1155 History   First MD Initiated Contact with Patient 10/30/15 1222     Chief Complaint  Patient presents with  . Fall  . Shoulder Pain     (Consider location/radiation/quality/duration/timing/severity/associated sxs/prior Treatment) The history is provided by the patient.  Patient c/o fall/syncopal event last 2 days ago at home.  Patient states he had just stood up, felt faint, fell to floor. Hit head in fall. Abrasion to left forehead. C/o left shoulder pain since, mod-severe, worse w movement, left arm numbness and impaired use of left arm/weakness for the past couple days.  Symptoms have been constant/persistent since fall. Indicates post fall/syncope, family member was able to assist back up. Denies other recent syncope. Denies any associated chest pain or discomfort. No palpitations or irregular heart beat. No hx cad, or dysrhythmia. bp high in ED -  indicates not taking any meds for the past few months. Patient denies any other pain or injury.        Past Medical History  Diagnosis Date  . Arthritis     "left groin & right foot pains only when weather gets cold" (05/31/2013)  . Chest pain     a. 05/2013 Ant STEMI called but cath showed nl cors and EF 55-60%, Trop neg, D-dimer elevated->CTA  . Steatosis, liver 06/02/2013  . Hyperlipidemia   . HTN (hypertension)    Past Surgical History  Procedure Laterality Date  . Cardiac catheterization  05/31/2013    Normal but tortuous coronary arteries suggestive of hypertensive changes  . Left heart catheterization with coronary angiogram N/A 05/31/2013    Procedure: LEFT HEART CATHETERIZATION WITH CORONARY ANGIOGRAM;  Surgeon: Iran Ouch, MD;  Location: [facility]ATH [facility];  Service: Cardiovascular;  Laterality: N/A;   Family History  Problem Relation Age of Onset  . Coronary artery disease Mother     CABG   Social History  Substance Use Topics  . Smoking status: Former Smoker    Types:  Cigars  . Smokeless tobacco: Never Used  . Alcohol Use: Yes     Comment: 4 days per week    Review of Systems  Constitutional: Negative for fever and chills.  HENT: Negative for sore throat.   Eyes: Negative for visual disturbance.  Respiratory: Negative for shortness of breath.   Cardiovascular: Negative for chest pain.  Gastrointestinal: Negative for vomiting, abdominal pain and diarrhea.  Genitourinary: Negative for dysuria and flank pain.  Musculoskeletal: Negative for back pain and neck pain.  Skin: Negative for rash.  Neurological: Positive for numbness. Negative for speech difficulty.  Hematological: Does not bruise/bleed easily.  Psychiatric/Behavioral: Negative for confusion.      Allergies  Review of patient's allergies indicates no known allergies.  Home Medications   Prior to Admission medications   Medication Sig Start Date End Date Taking? Authorizing Provider  carvedilol (COREG) 6.25 MG tablet Take 1/2 tablet twice a day Patient taking differently: Take 3.125 mg by mouth 2 (two) times daily.  05/05/15  Yes Clydia Llano, MD  Menthol, Topical Analgesic, (BENGAY COLD THERAPY) 5 % GEL Apply 1 application topically daily as needed (for pain).   Yes Historical Provider, MD  fenofibrate (TRICOR) 145 MG tablet Take 1 tablet (145 mg total) by mouth daily. Patient not taking: Reported on 10/30/2015 05/05/15   Clydia Llano, MD  Omega-3 Fatty Acids (FISH OIL) 1200 MG CAPS Take 3 capsules (3,600 mg total) by mouth daily. Patient not taking:  Reported on 10/30/2015 06/20/13   Iran Ouch, MD  rosuvastatin (CRESTOR) 10 MG tablet Take 1 tablet (10 mg total) by mouth daily. Patient not taking: Reported on 10/30/2015 05/05/15   Clydia Llano, MD   BP 195/117 mmHg  Pulse 105  Temp(Src) 99.3 F (37.4 C) (Oral)  Resp 15  Ht 5\' 7"  (1.702 m)  Wt 81.647 kg  BMI 28.19 kg/m2  SpO2 97% Physical Exam  Constitutional: He is oriented to person, place, and time. He appears well-developed and  well-nourished. No distress.  HENT:  Contusion/abrasion left forehead.   Eyes: Conjunctivae and EOM are normal. Pupils are equal, round, and reactive to light. No scleral icterus.  Neck: Neck supple. No tracheal deviation present.  No bruits.   Cardiovascular: Normal rate, regular rhythm, normal heart sounds and intact distal pulses.  Exam reveals no gallop and no friction rub.   No murmur heard. Pulmonary/Chest: Effort normal and breath sounds normal. No accessory muscle usage. No respiratory distress. He exhibits no tenderness.  Abdominal: Soft. Bowel sounds are normal. He exhibits no distension. There is no tenderness.  Genitourinary:  No cva tenderness  Musculoskeletal: Normal range of motion.  Mild cervical tenderness, otherwise, CTLS spine, non tender, aligned, no step off. Tenderness left shoulder, otherwise no other focal bony tenderness on bil extremity exam. Radial pulse 2+.    Neurological: He is alert and oriented to person, place, and time.  LUE weakness. Finger abduction, wrist dorsiflexion 4/5.  Patient notes subjectively decreased sensation LUE/hand. ?rad/uln palsy due to 2 day hx dislocation.   Skin: Skin is warm and dry. No rash noted. He is not diaphoretic.  Psychiatric: He has a normal mood and affect.  Nursing note and vitals reviewed.   ED Course  .Sedation Date/Time: 10/30/2015 3:36 PM Performed by: Cathren Laine Authorized by: Cathren Laine  Consent:    Consent obtained:  Written and verbal   Consent given by:  Patient   Risks discussed:  Prolonged sedation necessitating reversal, respiratory compromise necessitating ventilatory assistance and intubation and inadequate sedation   Alternatives discussed:  Analgesia without sedation Indications:    Sedation purpose:  Dislocation reduction   Procedure necessitating sedation performed by:  Physician performing sedation   Intended level of sedation:  Deep and moderate (conscious sedation) Pre-sedation  assessment:    NPO status caution comment:  4 hrs Immediate pre-procedure details:    Reviewed: vital signs     Verified: bag valve mask available, emergency equipment available, IV patency confirmed, oxygen available and suction available   Procedure details (see MAR for exact dosages):    Sedation start time:  10/30/2015 2:29 PM   Preoxygenation:  Nasal cannula   Sedation:  Propofol   Intra-procedure monitoring:  Blood pressure monitoring, cardiac monitor and continuous pulse oximetry   Intra-procedure events: none     Sedation end time:  10/30/2015 2:46 PM Post-procedure details:    Post-sedation assessment completed:  10/30/2015 3:00 PM   Attendance: Constant attendance by certified staff until patient recovered     Recovery: Patient returned to pre-procedure baseline     Patient is stable for discharge or admission: Yes     Patient tolerance:  Tolerated well, no immediate complications Reduction of dislocation Date/Time: 10/30/2015 3:36 PM Performed by: Cathren Laine Authorized by: Cathren Laine Consent: Verbal consent obtained. Written consent obtained. Risks and benefits: risks, benefits and alternatives were discussed Consent given by: patient Patient identity confirmed: arm band and verbally with patient Time out: Immediately prior to  procedure a "time out" was called to verify the correct patient, procedure, equipment, support staff and site/side marked as required. Patient sedated: yes Vitals: Vital signs were monitored during sedation. Patient tolerance: Patient tolerated the procedure well with no immediate complications   (including critical care time) Labs Review   Results for orders placed or performed during the hospital encounter of 10/30/15  Basic metabolic panel  Result Value Ref Range   Sodium 129 (L) 135 - 145 mmol/L   Potassium 3.5 3.5 - 5.1 mmol/L   Chloride 93 (L) 101 - 111 mmol/L   CO2 21 (L) 22 - 32 mmol/L   Glucose, Bld 101 (H) 65 - 99 mg/dL   BUN 11 6 -  20 mg/dL   Creatinine, Ser 6.96 (L) 0.61 - 1.24 mg/dL   Calcium 9.7 8.9 - 29.5 mg/dL   GFR calc non Af Amer >60 >60 mL/min   GFR calc Af Amer >60 >60 mL/min   Anion gap 15 5 - 15  CBC  Result Value Ref Range   WBC 14.0 (H) 4.0 - 10.5 K/uL   RBC 3.91 (L) 4.22 - 5.81 MIL/uL   Hemoglobin 13.3 13.0 - 17.0 g/dL   HCT 28.4 (L) 13.2 - 44.0 %   MCV 98.2 78.0 - 100.0 fL   MCH 34.0 26.0 - 34.0 pg   MCHC 34.6 30.0 - 36.0 g/dL   RDW 10.2 72.5 - 36.6 %   Platelets 236 150 - 400 K/uL  I-stat troponin, ED  Result Value Ref Range   Troponin i, poc 0.00 0.00 - 0.08 ng/mL   Comment 3           Dg Chest 2 View  10/30/2015  CLINICAL DATA:  Syncope EXAM: CHEST  2 VIEW COMPARISON:  05/02/2015 FINDINGS: Left glenohumeral dislocation, anterior. Normal heart size and mediastinal contours. No acute infiltrate or edema. No effusion or pneumothorax. Mild thoracic dextroscoliosis IMPRESSION: 1. No active cardiopulmonary disease. 2. Left glenohumeral dislocation. Electronically Signed   By: Marnee Spring M.D.   On: 10/30/2015 13:07   Ct Head Wo Contrast  10/30/2015  CLINICAL DATA:  Syncope, fall Monday night. Left shoulder pain. Left arm numbness. EXAM: CT HEAD WITHOUT CONTRAST CT CERVICAL SPINE WITHOUT CONTRAST TECHNIQUE: Multidetector CT imaging of the head and cervical spine was performed following the standard protocol without intravenous contrast. Multiplanar CT image reconstructions of the cervical spine were also generated. COMPARISON:  None. FINDINGS: CT HEAD FINDINGS No acute intracranial abnormality. Specifically, no hemorrhage, hydrocephalus, mass lesion, acute infarction, or significant intracranial injury. No acute calvarial abnormality. Visualized paranasal sinuses and mastoids clear. Orbital soft tissues unremarkable. CT CERVICAL SPINE FINDINGS Normal alignment. No fracture. Early disc space narrowing at C5-6. Prevertebral soft tissues are normal. No epidural or paraspinal hematoma. IMPRESSION: No acute  intracranial abnormality. No acute bony abnormality in the cervical spine. Electronically Signed   By: Charlett Nose M.D.   On: 10/30/2015 13:16   Ct Cervical Spine Wo Contrast  10/30/2015  CLINICAL DATA:  Syncope, fall Monday night. Left shoulder pain. Left arm numbness. EXAM: CT HEAD WITHOUT CONTRAST CT CERVICAL SPINE WITHOUT CONTRAST TECHNIQUE: Multidetector CT imaging of the head and cervical spine was performed following the standard protocol without intravenous contrast. Multiplanar CT image reconstructions of the cervical spine were also generated. COMPARISON:  None. FINDINGS: CT HEAD FINDINGS No acute intracranial abnormality. Specifically, no hemorrhage, hydrocephalus, mass lesion, acute infarction, or significant intracranial injury. No acute calvarial abnormality. Visualized paranasal sinuses and mastoids clear. Orbital  soft tissues unremarkable. CT CERVICAL SPINE FINDINGS Normal alignment. No fracture. Early disc space narrowing at C5-6. Prevertebral soft tissues are normal. No epidural or paraspinal hematoma. IMPRESSION: No acute intracranial abnormality. No acute bony abnormality in the cervical spine. Electronically Signed   By: Charlett Nose M.D.   On: 10/30/2015 13:16   Dg Shoulder Left  10/30/2015  CLINICAL DATA:  Status post reduction of left shoulder. EXAM: LEFT SHOULDER - 2+ VIEW COMPARISON:  Earlier today FINDINGS: Initial encounter Relocated left glenohumeral joint. Deep Hill-Sachs deformity. No Bankart fracture noted. Normal AC joint alignment. IMPRESSION: 1. Relocated glenohumeral joint. 2. Hill-Sachs fracture. Electronically Signed   By: Marnee Spring M.D.   On: 10/30/2015 14:55   Dg Shoulder Left  10/30/2015  CLINICAL DATA:  Witnessed fall with loss of consciousness. Left shoulder pain. EXAM: LEFT SHOULDER - 2+ VIEW COMPARISON:  None. FINDINGS: Two-view exam of the left shoulder shows anterior humeral head dislocation. Coracoid superimposed on the glenoid. Probable associated  Hill-Sachs deformity in the humeral head. IMPRESSION: Anterior humeral head dislocation. Electronically Signed   By: Kennith Center M.D.   On: 10/30/2015 13:08      I have personally reviewed and evaluated these images and lab results as part of my medical decision-making.   EKG Interpretation   Date/Time:  Wednesday October 30 2015 12:15:45 EDT Ventricular Rate:  102 PR Interval:    QRS Duration: 94 QT Interval:  345 QTC Calculation: 450 R Axis:   27 Text Interpretation:  Sinus tachycardia Nonspecific ST abnormality No  significant change since last tracing Confirmed by Deloma Spindle  MD, Caryn Bee  ([id number]) on 10/30/2015 12:22:33 PM      MDM   Iv ns. Labs. Imaging studies.  Reviewed nursing notes and prior charts for additional history.   Shoulder dislocated on xr.  Discussed reduction/procedural sedation w pt - patient gave written and verbal consent.  Reduction and procedural sedation completed without difficulty or complication. Shoulder immobilizer placed by ortho tech. Ice pack. Radial pulse 2+.  Patient remains hypertensive (pt not compliant w meds for several weeks), hx same. Prior cardiology note reviewed - rec then for continuing carvedilol, lisinopril/hctz, and possibly adding norvasc.  Dose of home med given in ED.    Recheck patient, pain controlled. Radial pulse 2+. No new or worsening numbness/weakness. Compartments of shoulder/arm without significant swelling/soft, not tense.  Suspect n palsy due to patient w dislocated shoulder at home for the past couple of days. Now reduced, no new symptoms. Ortho on call, Dr Roda Shutters consulted to insure prompt outpt f/u, and f/u palsy to ensure therapy/PT as outpt - he indicates via nurse to have f/u office.   On clarification of home bp meds, pt requests rx for home.  Patient indicates no adverse rxn to lisinopril/hctz or coreg in past - will give rx for home.     Cathren Laine, MD 10/30/15 [phone]

## 2015-10-30 NOTE — ED Notes (Signed)
Patient presents for syncopal episode on Monday. Reports witnessed fall by cousin, positive LOC, no anticoagulants. C/o left shoulder pain and left arm numbness. Denies lightheadedness, dizziness, SOB, back or chest pain. A&O x4. Small abrasion noted to left forehead. Hasn't had antihypertensives in approximately 4 months.

## 2015-10-30 NOTE — ED Notes (Signed)
Pt neighbor: Winferd Humphrey [phone]  Neighbor can be called to pick pt up after discharge. Pt aware.

## 2015-10-30 NOTE — Progress Notes (Signed)
[facility] ED CM noted pt with coverage but no pcp listed Spoke with pt who confirms no pcp but aware of how to obtain one with insurance coverage customer service number or web site   Cm reviewed ED level of care for crisis/emergent services and community pcp level of care to manage continuous or chronic medical concerns.  The pt voiced understanding CM encouraged pt and discussed pt's responsibility to verify with pt's insurance carrier that any recommended medical provider offered by any emergency room or a hospital provider is within the carrier's network. The pt voiced understanding   Pt assisted to raise hob up to 30 degrees - ED RN states pt ok to have fluids Pt assisted with Orange Juice Pt belongings placed in a pt belonging bag to include brown framed glasses, black wallet and blue t shirt CM left pt cell phone on bedside table

## 2015-10-30 NOTE — ED Notes (Addendum)
RN is starting an IV line and drawing blood work 

## 2015-10-31 ENCOUNTER — Telehealth: Payer: Self-pay | Admitting: *Deleted

## 2015-11-06 ENCOUNTER — Other Ambulatory Visit: Payer: Self-pay | Admitting: Orthopaedic Surgery

## 2015-11-06 DIAGNOSIS — M25512 Pain in left shoulder: Secondary | ICD-10-CM

## 2015-11-15 ENCOUNTER — Other Ambulatory Visit: Payer: Self-pay | Admitting: Orthopaedic Surgery

## 2015-11-15 DIAGNOSIS — T1590XA Foreign body on external eye, part unspecified, unspecified eye, initial encounter: Secondary | ICD-10-CM

## 2015-11-24 ENCOUNTER — Ambulatory Visit
Admission: RE | Admit: 2015-11-24 | Discharge: 2015-11-24 | Disposition: A | Discharge status: Home / Self Care | Payer: BLUE CROSS/BLUE SHIELD | Source: Ambulatory Visit | Attending: Orthopaedic Surgery | Admitting: Orthopaedic Surgery

## 2015-11-24 DIAGNOSIS — M25512 Pain in left shoulder: Secondary | ICD-10-CM

## 2015-12-05 DIAGNOSIS — I10 Essential (primary) hypertension: Secondary | ICD-10-CM | POA: Insufficient documentation

## 2015-12-25 ENCOUNTER — Other Ambulatory Visit: Payer: Self-pay | Admitting: Orthopedic Surgery

## 2016-01-01 NOTE — Pre-Procedure Instructions (Signed)
Dylan Summers  01/01/2016      [facility] - [place],  - [address] [place] [address] Phone: [phone] Fax: [phone]  [facility] [address] [place], [place] - [address] [place] [address] Phone: [phone] Fax: [phone]    Your procedure is scheduled on Tues, Sept 12 @ 12:10 PM  Report to [facility] Admitting at 10:10 AM  Call this number if you have problems the morning of surgery:  [phone]   Remember:  Do not eat food or drink liquids after midnight.  Take these medicines the morning of surgery with A SIP OF WATER Carvedilol(Coreg),Gabapentin(Neurontin),and Pain Pill(if needed)              Stop taking your Fish Oil. NO Goody's,BC's,Aleve,Advil,Motrin,Ibuprofen,or any Herbal Medications.    Do not wear jewelry.  Do not wear lotions, powders, colognes, or deoderant.   Men may shave face and neck.  Do not bring valuables to the hospital.  [facility] is not responsible for any belongings or valuables.  Contacts, dentures or bridgework may not be worn into surgery.  Leave your suitcase in the car.  After surgery it may be brought to your room.  For patients admitted to the hospital, discharge time will be determined by your treatment team.  Patients discharged the day of surgery will not be allowed to drive home.   [facility]  Before surgery, you can play an important role.  Because skin is not sterile, your skin needs to be as free of germs as possible.  You can reduce the number of germs on you skin by washing with CHG (chlorahexidine gluconate) soap before surgery.  CHG is an antiseptic cleaner which kills germs and bonds with the skin to continue killing germs even after washing.  Please DO NOT use if you have an allergy to CHG or antibacterial soaps.  If your skin becomes reddened/irritated stop  using the CHG and inform your nurse when you arrive at Short Stay.  Do not shave (including legs and underarms) for at least 48 hours prior to the first CHG shower.  You may shave your face.  Please follow these instructions carefully:   1.  Shower with CHG Soap the night before surgery and the                                morning of Surgery.  2.  If you choose to wash your hair, wash your hair first as usual with your       normal shampoo.  3.  After you shampoo, rinse your hair and body thoroughly to remove the                      Shampoo.  4.  Use CHG as you would any other liquid soap.  You can apply chg directly       to the skin and wash gently with scrungie or a clean washcloth.  5.  Apply the CHG Soap to your body ONLY FROM THE NECK DOWN.        Do not use on open wounds or open sores.  Avoid contact with your eyes,       ears, mouth and genitals (private parts).  Wash genitals (private parts)  with your normal soap.  6.  Wash thoroughly, paying special attention to the area where your surgery        will be performed.  7.  Thoroughly rinse your body with warm water from the neck down.  8.  DO NOT shower/wash with your normal soap after using and rinsing off       the CHG Soap.  9.  Pat yourself dry with a clean towel.            10.  Wear clean pajamas.            11.  Place clean sheets on your bed the night of your first shower and do not        sleep with pets.  Day of Surgery  Do not apply any lotions/deoderants the morning of surgery.  Please wear clean clothes to the hospital/surgery center.     Please read over the following fact sheets that you were given. Pain Booklet and Coughing and Deep Breathing

## 2016-01-02 ENCOUNTER — Encounter (HOSPITAL_COMMUNITY): Payer: Self-pay

## 2016-01-02 ENCOUNTER — Encounter (HOSPITAL_COMMUNITY)
Admission: RE | Admit: 2016-01-02 | Discharge: 2016-01-02 | Disposition: A | Discharge status: Home / Self Care | Payer: BLUE CROSS/BLUE SHIELD | Source: Ambulatory Visit | Attending: Orthopedic Surgery | Admitting: Orthopedic Surgery

## 2016-01-02 DIAGNOSIS — Z01818 Encounter for other preprocedural examination: Secondary | ICD-10-CM | POA: Insufficient documentation

## 2016-01-02 LAB — COMPREHENSIVE METABOLIC PANEL
ALT: 59 U/L (ref 17–63)
AST: 48 U/L — AB (ref 15–41)
Albumin: 3.6 g/dL (ref 3.5–5.0)
Alkaline Phosphatase: 207 U/L — ABNORMAL HIGH (ref 38–126)
Anion gap: 12 (ref 5–15)
BUN: 80 mg/dL — AB (ref 6–20)
CHLORIDE: 103 mmol/L (ref 101–111)
CO2: 17 mmol/L — ABNORMAL LOW (ref 22–32)
Calcium: 10.1 mg/dL (ref 8.9–10.3)
Creatinine, Ser: 2.1 mg/dL — ABNORMAL HIGH (ref 0.61–1.24)
GFR, EST AFRICAN AMERICAN: 41 mL/min — AB (ref >60)
GFR, EST NON AFRICAN AMERICAN: 35 mL/min — AB (ref >60)
Glucose, Bld: 129 mg/dL — ABNORMAL HIGH (ref 65–99)
POTASSIUM: 4.7 mmol/L (ref 3.5–5.1)
Sodium: 132 mmol/L — ABNORMAL LOW (ref 135–145)
Total Bilirubin: 1.3 mg/dL — ABNORMAL HIGH (ref 0.3–1.2)
Total Protein: 6.8 g/dL (ref 6.5–8.1)

## 2016-01-02 LAB — CBC
HEMATOCRIT: 27.8 % — AB (ref 39.0–52.0)
Hemoglobin: 9.5 g/dL — ABNORMAL LOW (ref 13.0–17.0)
MCH: 32 pg (ref 26.0–34.0)
MCHC: 34.2 g/dL (ref 30.0–36.0)
MCV: 93.6 fL (ref 78.0–100.0)
Platelets: 239 10*3/uL (ref 150–400)
RBC: 2.97 MIL/uL — AB (ref 4.22–5.81)
RDW: 15.6 % — ABNORMAL HIGH (ref 11.5–15.5)
WBC: 11.1 10*3/uL — AB (ref 4.0–10.5)

## 2016-01-02 LAB — PROTIME-INR
INR: 1.15
Prothrombin Time: 14.7 seconds (ref 11.4–15.2)

## 2016-01-02 NOTE — Progress Notes (Signed)
Pt. Has had  misguided disjointed care follow up.  Pt. Unsure of his Carvedilol dosing, reports that the last time he picked up his med. fr. RX it was double the dose he had been taking so he has dropped down to QD instead of BID ( which it was originally), pt. Also reports that he was having  adverse effects of dizziness etc. ( see MAR remarks ), for which he has not communicatred with his MD.  Pt. Has not communicated this to his PCP. Pt. D/d'd fr. [facility] 04/2015- told to f/u with GI & he admits that he has not. Rocommended to pt.  that it could benefit him to see PCP before surgery to make sure that he is good condition for anesth. , pt. advised to do this today or tomorrow .

## 2016-01-03 ENCOUNTER — Encounter (HOSPITAL_COMMUNITY): Payer: Self-pay | Admitting: Emergency Medicine

## 2016-01-03 NOTE — Progress Notes (Signed)
Anesthesia Chart Review:  Pt is a 50 year old male scheduled for L shoulder diagnostic arthroscopy, biceps tenodesis, open labral repair and rotator cuff repair on 01/07/2016 with Cammy Copa, MD.   PMH includes:  HTN, hyperlipidemia, liver steatosis. History alcohol abuse (~3 shots per day). Former smoker. BMI 28.5  BP (!) 92/53   Temp 36.4 C   Resp 20   Ht 5\' 7"  (1.702 m)   Wt 181 lb 4.8 oz (82.2 kg)   SpO2 100%   BMI 28.40 kg/m   Medications include: carvedilol, lisinopril-hctz, potassium  Preoperative labs reviewed.   - H/H 9.5/27.8 (hgb was 13.3 two months ago) - Cr 2.1, BUN 80. Renal function was normal 2 months ago  CXR 10/30/15:  1. No active cardiopulmonary disease. 2. Left glenohumeral dislocation.  EKG 10/30/15: Sinus tachycardia (102 bpm). Nonspecific ST abnormality  Echo 05/03/15:  - Left ventricle: The cavity size was normal. Systolic function was normal. The estimated ejection fraction was in the range of 60% to 65%. Wall motion was normal; there were no regional wall motion abnormalities. Doppler parameters are consistent with abnormal left ventricular relaxation (grade 1 diastolic dysfunction). - Atrial septum: No defect or patent foramen ovale was identified.  Cardiac cath 05/31/13:  1. Normal coronary arteries  2. Normal LV systolic function and left ventricular end-diastolic pressure. 3. No evidence of thoracic aortic aneurysm or dissection.  Reviewed case with Dr. Desmond Lope. Pt will need medical evaluation for new anemia and renal insufficiency. I notified Kim in Dr. Diamantina Providence office and routed labs to pt's PCP, Jenita Seashore, MD in [facility], for follow up.   Rica Mast, FNP-BC [facility]/Anesthesiology Phone: [phone] 01/03/2016 2:42 PM

## 2016-01-07 ENCOUNTER — Encounter (HOSPITAL_COMMUNITY): Admission: RE | Payer: Self-pay | Source: Ambulatory Visit

## 2016-01-07 ENCOUNTER — Ambulatory Visit (HOSPITAL_COMMUNITY)
Admission: RE | Admit: 2016-01-07 | Payer: BLUE CROSS/BLUE SHIELD | Source: Ambulatory Visit | Admitting: Orthopedic Surgery

## 2016-01-07 SURGERY — SHOULDER ARTHROSCOPY WITH SUBACROMIAL DECOMPRESSION, ROTATOR CUFF REPAIR AND BICEP TENDON REPAIR
Anesthesia: General | Site: Shoulder | Laterality: Left

## 2016-02-24 ENCOUNTER — Ambulatory Visit (INDEPENDENT_AMBULATORY_CARE_PROVIDER_SITE_OTHER): Payer: Self-pay | Admitting: Orthopedic Surgery

## 2016-02-24 ENCOUNTER — Encounter (INDEPENDENT_AMBULATORY_CARE_PROVIDER_SITE_OTHER): Payer: Self-pay | Admitting: Orthopedic Surgery

## 2016-02-24 DIAGNOSIS — G540 Brachial plexus disorders: Secondary | ICD-10-CM

## 2016-02-24 DIAGNOSIS — S43005D Unspecified dislocation of left shoulder joint, subsequent encounter: Secondary | ICD-10-CM

## 2016-02-24 DIAGNOSIS — S4432XD Injury of axillary nerve, left arm, subsequent encounter: Secondary | ICD-10-CM

## 2016-02-24 NOTE — Progress Notes (Signed)
Office Visit Note   Patient: Dylan Summers           Date of Birth: 1965-08-16           MRN: [id number] Visit Date: 02/24/2016 Requested by: No referring provider defined for this encounter. PCP: No PCP Per Patient  Subjective: Chief Complaint  Patient presents with  . Left Shoulder - Pain  50 year old patient comes back in today for repeat evaluation of left shoulder.  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  He is been 4 months since his injury.  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  He states that his hand has improved.  Still reports some weakness in the shoulder but overall he is functional and desires to return to work.  I reviewed his MRI scan which did show retracted supraspinatus tear as well as a subscap tear with medial subluxation of the biceps tendon.  Subscap tear was not really particularly retracted that the biceps tendon was subluxated.  He also had changes consistent with dislocation including a Hill-Sachs and action fracture.  Patient returns today in followup for left shoulder pain.  He says he has no pain at all, the left shoulder is better.  He is not taking any medication for the left shoulder. He still has not returned to work. He says he" needs to be fully released". The way we filled out the forms is not acceptable, they will not let him return to work with those forms as they are.  He needs to be released to return to work with no restrictions.                 Review of Systems all systems reviewed and negative they relate to the chief complaint.  No fevers or chills   Assessment & Plan: Visit Diagnoses:  1. Dislocation of shoulder, left, closed, subsequent encounter   2. Brachial plexopathy   3. Injury of left axillary nerve, subsequent encounter     Plan: I had a  long talk today with Dylan Summers about his situation with his left shoulder.  At this time he potentially has a fixable rotator cuff tear in the left shoulder  especially the subscapularis.  The supraspinatus is retracted but I think also be fixable at this time.  He does have some weakness on exam particularly in the infraspinatus and supraspinatus portions of the exam.  Subscap strength actually is somewhat better today.  His deltoid is firing.  He has not had any episodes of instability.  His hand function has improved significantly but still has some ulnar nerve symptoms with intraosseous weakness but it has improved.  I Think it should continue to improve as well.  The main issue at this time is his left shoulder potentially repairable rotator cuff tear which is retracted and if we go in to next year it Caralee Morea become unrepairable.  Patient desires return to work and I think functionally he is getting better and could be able to return to work with his shoulder in its current situation.  I think that he wants to be released without any restrictions which I can do however it is explained to him at length with many opportunities to answer questions and ask questions that that rotator cuff tear will become unrepairable in the future and that will put his shoulder on the road to potentially needing shoulder replacement.  That's something that Jovanni Rash be preventable with surgery at this time  but that is not intervention that he is instructed in pursuing at all.  To that and I will release him to go to work.  In regards to his shoulder I would favor intervention over observation but his situation is such that he has many financial obligations to meet and he and is running out of options in terms of meeting this financial obligations other than returning to work.  Again my preference and my recommendation is for surgery to fix the rotator cuff pathology that's present because without that fix he will go on to rotator cuff arthropathy and will require significantly greater surgery in the future to address this problem than he would require right now.  Patient understands and declines  that recommendation at this time.  I did allow him to go back to work without restrictions. i Think it's possible and likely  that he will develop more symptoms in that left shoulder in the future.  That would be expected.  I'm going to see him back in 4 months for clinical recheck.  Follow-Up Instructions: No Follow-up on file.   Orders:  No orders of the defined types were placed in this encounter.  No orders of the defined types were placed in this encounter.     Procedures: No procedures performed   Clinical Data: No additional findings.  Objective: Vital Signs: There were no vitals taken for this visit.  Physical Exam  Constitutional: He appears well-developed.  HENT:  Head: Normocephalic.  Eyes: EOM are normal.  Neck: Normal range of motion.  Cardiovascular: Normal rate.   Pulmonary/Chest: Effort normal.  Neurological: He is alert.  Skin: Skin is warm.  Psychiatric: He has a normal mood and affect.    Ortho Exam examination the left hand demonstrates continued interosseous wasting but his interosseous strength has improved.  Finger extension strength at the thumb as well as MCP joints is also improved but is not yet at full strength.  Grip strength is close to the right-hand side.  Wrist flexion extension biceps triceps strength also diminished left versus right but improved.  Supraspinatus and infraspinatus strength testing also diminished at 3 out of 5 he does not have any radiating pain into the biceps region on the left-hand side negative apprehension relocation testing on the left  Specialty Comments:  No specialty comments available.  Imaging: No results found.   PMFS History: Patient Active Problem List   Diagnosis Date Noted  . Exertional dyspnea 05/03/2015  . Alcoholic hepatitis 05/03/2015  . Hyponatremia 05/03/2015  . Macrocytic anemia 05/03/2015  . Thrombocytopenia (HCC) 05/03/2015  . Alcohol abuse 05/03/2015  . Dehydration 05/02/2015  .  Hyperlipidemia   . Steatosis, liver 06/02/2013  . Tobacco abuse 06/01/2013  . Transaminitis 06/01/2013  . STEMI (ST elevation myocardial infarction) 05/31/13 05/31/2013  . HTN (hypertension) 05/31/2013  . Family history of coronary artery bypass surgery 05/31/2013  . Chest pain 05/31/2013   Past Medical History:  Diagnosis Date  . Arthritis    "left groin & right foot pains only when weather gets cold" (05/31/2013)  . Chest pain    a. 05/2013 Ant STEMI called but cath showed nl cors and EF 55-60%, Trop neg, D-dimer elevated->CTA  . HTN (hypertension)   . Hyperlipidemia   . Steatosis, liver 06/02/2013    Family History  Problem Relation Age of Onset  . Coronary artery disease Mother     CABG    Past Surgical History:  Procedure Laterality Date  . CARDIAC CATHETERIZATION  05/31/2013   Normal but tortuous coronary arteries suggestive of hypertensive changes  . CLOSED REDUCTION SHOULDER DISLOCATION Left   . LEFT HEART CATHETERIZATION WITH CORONARY ANGIOGRAM N/A 05/31/2013   Procedure: LEFT HEART CATHETERIZATION WITH CORONARY ANGIOGRAM;  Surgeon: Iran Ouch, MD;  Location: [facility]ATH [facility];  Service: Cardiovascular;  Laterality: N/A;   Social History   Occupational History  . Not on file.   Social History Main Topics  . Smoking status: Former Smoker    Types: Cigars  . Smokeless tobacco: Never Used  . Alcohol use 6.0 - 7.2 oz/week    10 - 12 Shots of liquor per week     Comment: 4 days per week  . Drug use: No  . Sexual activity: Yes     Comment: 05/31/2013 "might smoke a Black N Mild once/wk"

## 2017-10-25 DIAGNOSIS — I2510 Atherosclerotic heart disease of native coronary artery without angina pectoris: Secondary | ICD-10-CM

## 2017-10-25 HISTORY — DX: Atherosclerotic heart disease of native coronary artery without angina pectoris: I25.10

## 2017-11-09 ENCOUNTER — Ambulatory Visit (INDEPENDENT_AMBULATORY_CARE_PROVIDER_SITE_OTHER): Payer: Managed Care, Other (non HMO) | Admitting: Cardiovascular Disease

## 2017-11-09 ENCOUNTER — Encounter: Payer: Self-pay | Admitting: Cardiovascular Disease

## 2017-11-09 VITALS — BP 185/99 | HR 86 | Ht 67.0 in | Wt 195.8 lb

## 2017-11-09 DIAGNOSIS — I10 Essential (primary) hypertension: Secondary | ICD-10-CM | POA: Diagnosis not present

## 2017-11-09 DIAGNOSIS — Z01810 Encounter for preprocedural cardiovascular examination: Secondary | ICD-10-CM | POA: Diagnosis not present

## 2017-11-09 NOTE — Progress Notes (Signed)
Cardiology Office Note   Date:  11/09/2017   ID:  MICHAEEL Summers, DOB [birth year], MRN [id number]  PCP:  [name], MD  Cardiologist:   [name], MD   No chief complaint on file.     History of Present Illness: Dylan Summers is a 52 y.o. male who was referred by Dr. Jenita Seashore for preoperative cardiovascular evaluation before hip surgery. The patient was most recently seen by me in 2015. He has known history of hypertension and excessive alcohol use. He presented in 2015 to [facility] with shortness of breath and chest pain. His EKG showed mild anterior ST elevation and thus he was taken emergently to the cath lab. Cardiac catheterization showed no evidence of coronary artery disease. His arteries were tortuous suggestive of hypertensive changes. D-dimer was mildly elevated. CTA of the chest showed no evidence of pulmonary embolism. The patient is known to have history of excessive alcohol use with fatty liver and elevated triglyceride.  He used to drink 1 pint of vodka daily but he reports that he now drinks 2 drinks daily. He had an echocardiogram done in 2017 which showed normal LV systolic function and no significant valvular abnormalities. He has been doing well with no chest pain, shortness of breath or palpitations.  He does not smoke.   Past Medical History:  Diagnosis Date  . Arthritis    "left groin & right foot pains only when weather gets cold" (05/31/2013)  . Chest pain    a. 05/2013 Ant STEMI called but cath showed nl cors and EF 55-60%, Trop neg, D-dimer elevated->CTA  . HTN (hypertension)   . Hyperlipidemia   . Steatosis, liver 06/02/2013    Past Surgical History:  Procedure Laterality Date  . CARDIAC CATHETERIZATION  05/31/2013   Normal but tortuous coronary arteries suggestive of hypertensive changes  . CLOSED REDUCTION SHOULDER DISLOCATION Left   . LEFT HEART CATHETERIZATION WITH CORONARY ANGIOGRAM N/A 05/31/2013   Procedure: LEFT  HEART CATHETERIZATION WITH CORONARY ANGIOGRAM;  Surgeon: Iran Ouch, MD;  Location: [facility] CATH [facility];  Service: Cardiovascular;  Laterality: N/A;     Current Outpatient Medications  Medication Sig Dispense Refill  . carvedilol (COREG) 12.5 MG tablet Take 12.5 mg by mouth 2 (two) times daily with a meal.    . lisinopril-hydrochlorothiazide (PRINZIDE,ZESTORETIC) 20-25 MG tablet Take 1 tablet by mouth daily. 30 tablet 0  . Omega-3 Fatty Acids (FISH OIL) 1000 MG CAPS Take 1 capsule by mouth 3 (three) times daily.     No current facility-administered medications for this visit.     Allergies:   Patient has no known allergies.    Social History:  The patient  reports that he has quit smoking. His smoking use included cigars. He has never used smokeless tobacco. He reports that he drinks about 6.0 - 7.2 oz of alcohol per week. He reports that he does not use drugs.   Family History:  The patient's family history includes Coronary artery disease in his mother; Heart attack in his mother; Heart disease in his mother; Other in his father.    ROS:  Please see the history of present illness.   Otherwise, review of systems are positive for none.   All other systems are reviewed and negative.    PHYSICAL EXAM: VS:  BP (!) 185/99   Pulse 86   Ht 5\' 7"  (1.702 m)   Wt 195 lb 12.8 oz (88.8 kg)   BMI 30.67 kg/m  ,  BMI Body mass index is 30.67 kg/m. GEN: Well nourished, well developed, in no acute distress  HEENT: normal  Neck: no JVD, carotid bruits, or masses Cardiac: RRR; no murmurs, rubs, or gallops,no edema  Respiratory:  clear to auscultation bilaterally, normal work of breathing GI: soft, nontender, nondistended, + BS MS: no deformity or atrophy  Skin: warm and dry, no rash Neuro:  Strength and sensation are intact Psych: euthymic mood, full affect   EKG:  EKG is ordered today. The ekg ordered today demonstrates normal sinus rhythm with no significant ST or T wave  changes.   Recent Labs: No results found for requested labs within last 8760 hours.    Lipid Panel    Component Value Date/Time   CHOL 898 (H) 05/05/2015 0627   TRIG 344 (H) 05/05/2015 0627   HDL 18 (L) 05/05/2015 0627   CHOLHDL 49.9 05/05/2015 0627   VLDL 69 (H) 05/05/2015 0627   LDLCALC 811 (H) 05/05/2015 0627      Wt Readings from Last 3 Encounters:  11/09/17 195 lb 12.8 oz (88.8 kg)  01/02/16 181 lb 4.8 oz (82.2 kg)  10/30/15 180 lb (81.6 kg)       PAD Screen 11/09/2017  Previous PAD dx? No  Previous surgical procedure? No  Pain with walking? No  Feet/toe relief with dangling? No  Painful, non-healing ulcers? No  Extremities discolored? No      ASSESSMENT AND PLAN:  1.  Preop cardiovascular evaluation before hip surgery: The patient has history of abnormal EKG likely due to early repolarization.  Previous cardiac catheterization in 2015 showed normal coronary arteries.  His EKG today is normal.  Echocardiogram in 2017 showed normal LV systolic function and no significant valvular abnormalities.  He currently has no anginal symptoms. Due to all of the above, the patient can proceed with surgery at an overall low risk.  I discussed with him the importance of controlling his blood pressure and taking his medications regularly.  2.  Essential hypertension: Blood pressure is elevated today but he has not been consistent in taking his medications and did not take his medications this morning.  I made no changes.  I stressed the importance of compliance. The patient should have routine labs done if not done recently including basic metabolic profile, lipid profile and liver profile given previous metabolic abnormalities related to excessive alcohol use.    Disposition:   FU with me in 1 year  Signed,  [name], MD  11/09/2017 9:33 AM    [facility]

## 2017-11-09 NOTE — Patient Instructions (Signed)

## 2017-11-19 ENCOUNTER — Other Ambulatory Visit: Payer: Self-pay | Admitting: Orthopedic Surgery

## 2017-11-25 ENCOUNTER — Other Ambulatory Visit: Payer: Self-pay | Admitting: Orthopedic Surgery

## 2017-11-26 NOTE — Patient Instructions (Signed)
Dylan Summers  11/26/2017   Your procedure is scheduled on: 12-06-17   Report to [facility] Main  Entrance    Report to Admitting at 10:00 AM    Call this number if you have problems the morning of surgery [phone]   Remember: Do not eat food or drink liquids :After Midnight.     Take these medicines the morning of surgery with A SIP OF WATER: Carvedilol (Coreg)                                You may not have any metal on your body including hair pins and              piercings  Do not wear jewelry, lotions, powders, cologne or  deodorant             Men may shave face and neck.   Do not bring valuables to the hospital. [facility] IS NOT             RESPONSIBLE   FOR VALUABLES.  Contacts, dentures or bridgework may not be worn into surgery.  Leave suitcase in the car. After surgery it may be brought to your room.       Special Instructions: N/A              Please read over the following fact sheets you were given: _____________________________________________________________________             [facility] - Preparing for Surgery Before surgery, you can play an important role.  Because skin is not sterile, your skin needs to be as free of germs as possible.  You can reduce the number of germs on your skin by washing with CHG (chlorahexidine gluconate) soap before surgery.  CHG is an antiseptic cleaner which kills germs and bonds with the skin to continue killing germs even after washing. Please DO NOT use if you have an allergy to CHG or antibacterial soaps.  If your skin becomes reddened/irritated stop using the CHG and inform your nurse when you arrive at Short Stay. Do not shave (including legs and underarms) for at least 48 hours prior to the first CHG shower.  You may shave your face/neck. Please follow these instructions carefully:  1.  Shower with CHG Soap the night before surgery and the  morning of Surgery.  2.  If you choose to wash  your hair, wash your hair first as usual with your  normal  shampoo.  3.  After you shampoo, rinse your hair and body thoroughly to remove the  shampoo.                           4.  Use CHG as you would any other liquid soap.  You can apply chg directly  to the skin and wash                       Gently with a scrungie or clean washcloth.  5.  Apply the CHG Soap to your body ONLY FROM THE NECK DOWN.   Do not use on face/ open                           Wound or  open sores. Avoid contact with eyes, ears mouth and genitals (private parts).                       Wash face,  Genitals (private parts) with your normal soap.             6.  Wash thoroughly, paying special attention to the area where your surgery  will be performed.  7.  Thoroughly rinse your body with warm water from the neck down.  8.  DO NOT shower/wash with your normal soap after using and rinsing off  the CHG Soap.                9.  Pat yourself dry with a clean towel.            10.  Wear clean pajamas.            11.  Place clean sheets on your bed the night of your first shower and do not  sleep with pets. Day of Surgery : Do not apply any lotions/deodorants the morning of surgery.  Please wear clean clothes to the hospital/surgery center.  FAILURE TO FOLLOW THESE INSTRUCTIONS MAY RESULT IN THE CANCELLATION OF YOUR SURGERY PATIENT SIGNATURE_________________________________  NURSE SIGNATURE__________________________________  ________________________________________________________________________   [name]  An incentive spirometer is a tool that can help keep your lungs clear and active. This tool measures how well you are filling your lungs with each breath. Taking long deep breaths may help reverse or decrease the chance of developing breathing (pulmonary) problems (especially infection) following:  A long period of time when you are unable to move or be active. BEFORE THE PROCEDURE   If the spirometer  includes an indicator to show your best effort, your nurse or respiratory therapist will set it to a desired goal.  If possible, sit up straight or lean slightly forward. Try not to slouch.  Hold the incentive spirometer in an upright position. INSTRUCTIONS FOR USE  1. Sit on the edge of your bed if possible, or sit up as far as you can in bed or on a chair. 2. Hold the incentive spirometer in an upright position. 3. Breathe out normally. 4. Place the mouthpiece in your mouth and seal your lips tightly around it. 5. Breathe in slowly and as deeply as possible, raising the piston or the ball toward the top of the column. 6. Hold your breath for 3-5 seconds or for as long as possible. Allow the piston or ball to fall to the bottom of the column. 7. Remove the mouthpiece from your mouth and breathe out normally. 8. Rest for a few seconds and repeat Steps 1 through 7 at least 10 times every 1-2 hours when you are awake. Take your time and take a few normal breaths between deep breaths. 9. The spirometer may include an indicator to show your best effort. Use the indicator as a goal to work toward during each repetition. 10. After each set of 10 deep breaths, practice coughing to be sure your lungs are clear. If you have an incision (the cut made at the time of surgery), support your incision when coughing by placing a pillow or rolled up towels firmly against it. Once you are able to get out of bed, walk around indoors and cough well. You may stop using the incentive spirometer when instructed by your caregiver.  RISKS AND COMPLICATIONS  Take your time so you do not get dizzy or  light-headed.  If you are in pain, you may need to take or ask for pain medication before doing incentive spirometry. It is harder to take a deep breath if you are having pain. AFTER USE  Rest and breathe slowly and easily.  It can be helpful to keep track of a log of your progress. Your caregiver can provide you with a  simple table to help with this. If you are using the spirometer at home, follow these instructions: [place] IF:   You are having difficultly using the spirometer.  You have trouble using the spirometer as often as instructed.  Your pain medication is not giving enough relief while using the spirometer.  You develop fever of 100.5 F (38.1 C) or higher. SEEK IMMEDIATE MEDICAL CARE IF:   You cough up bloody sputum that had not been present before.  You develop fever of 102 F (38.9 C) or greater.  You develop worsening pain at or near the incision site. MAKE SURE YOU:   Understand these instructions.  Will watch your condition.  Will get help right away if you are not doing well or get worse. Document Released: 08/24/2006 Document Revised: 07/06/2011 Document Reviewed: 10/25/2006 Exit[employer] Patient Information 2014 Exit[employer], [place].   ________________________________________________________________________  WHAT IS A BLOOD TRANSFUSION? Blood Transfusion Information  A transfusion is the replacement of blood or some of its parts. Blood is made up of multiple cells which provide different functions.  Red blood cells carry oxygen and are used for blood loss replacement.  White blood cells fight against infection.  Platelets control bleeding.  Plasma helps clot blood.  Other blood products are available for specialized needs, such as hemophilia or other clotting disorders. BEFORE THE TRANSFUSION  Who gives blood for transfusions?   Healthy volunteers who are fully evaluated to make sure their blood is safe. This is blood bank blood. Transfusion therapy is the safest it has ever been in the practice of medicine. Before blood is taken from a donor, a complete history is taken to make sure that person has no history of diseases nor engages in risky social behavior (examples are intravenous drug use or sexual activity with multiple partners). The donor's travel history  is screened to minimize risk of transmitting infections, such as malaria. The donated blood is tested for signs of infectious diseases, such as HIV and hepatitis. The blood is then tested to be sure it is compatible with you in order to minimize the chance of a transfusion reaction. If you or a relative donates blood, this is often done in anticipation of surgery and is not appropriate for emergency situations. It takes many days to process the donated blood. RISKS AND COMPLICATIONS Although transfusion therapy is very safe and saves many lives, the main dangers of transfusion include:   Getting an infectious disease.  Developing a transfusion reaction. This is an allergic reaction to something in the blood you were given. Every precaution is taken to prevent this. The decision to have a blood transfusion has been considered carefully by your caregiver before blood is given. Blood is not given unless the benefits outweigh the risks. AFTER THE TRANSFUSION  Right after receiving a blood transfusion, you will usually feel much better and more energetic. This is especially true if your red blood cells have gotten low (anemic). The transfusion raises the level of the red blood cells which carry oxygen, and this usually causes an energy increase.  The nurse administering the transfusion will monitor you  carefully for complications. HOME CARE INSTRUCTIONS  No special instructions are needed after a transfusion. You may find your energy is better. Speak with your caregiver about any limitations on activity for underlying diseases you may have. SEEK MEDICAL CARE IF:   Your condition is not improving after your transfusion.  You develop redness or irritation at the intravenous (IV) site. SEEK IMMEDIATE MEDICAL CARE IF:  Any of the following symptoms occur over the next 12 hours:  Shaking chills.  You have a temperature by mouth above 102 F (38.9 C), not controlled by medicine.  Chest, back, or  muscle pain.  People around you feel you are not acting correctly or are confused.  Shortness of breath or difficulty breathing.  Dizziness and fainting.  You get a rash or develop hives.  You have a decrease in urine output.  Your urine turns a dark color or changes to pink, red, or brown. Any of the following symptoms occur over the next 10 days:  You have a temperature by mouth above 102 F (38.9 C), not controlled by medicine.  Shortness of breath.  Weakness after normal activity.  The white part of the eye turns yellow (jaundice).  You have a decrease in the amount of urine or are urinating less often.  Your urine turns a dark color or changes to pink, red, or brown. Document Released: 04/10/2000 Document Revised: 07/06/2011 Document Reviewed: 11/28/2007 [facility] Patient Information 2014 [place], [place].  _______________________________________________________________________

## 2017-11-26 NOTE — Progress Notes (Signed)
11-09-17 (Epic) Cardiac Clearance from Dr. Kirke Corin and EKG  05-13-15 (ECHO)

## 2017-11-29 ENCOUNTER — Other Ambulatory Visit: Payer: Self-pay

## 2017-11-29 ENCOUNTER — Encounter (HOSPITAL_COMMUNITY)
Admission: RE | Admit: 2017-11-29 | Discharge: 2017-11-29 | Disposition: A | Discharge status: Home / Self Care | Payer: Managed Care, Other (non HMO) | Source: Ambulatory Visit | Attending: Orthopedic Surgery | Admitting: Orthopedic Surgery

## 2017-11-29 ENCOUNTER — Encounter (HOSPITAL_COMMUNITY): Payer: Self-pay

## 2017-11-29 DIAGNOSIS — Z01812 Encounter for preprocedural laboratory examination: Secondary | ICD-10-CM | POA: Insufficient documentation

## 2017-11-29 LAB — CBC
HEMATOCRIT: 35 % — AB (ref 39.0–52.0)
HEMOGLOBIN: 12.2 g/dL — AB (ref 13.0–17.0)
MCH: 34.1 pg — AB (ref 26.0–34.0)
MCHC: 34.9 g/dL (ref 30.0–36.0)
MCV: 97.8 fL (ref 78.0–100.0)
Platelets: 138 10*3/uL — ABNORMAL LOW (ref 150–400)
RBC: 3.58 MIL/uL — ABNORMAL LOW (ref 4.22–5.81)
RDW: 13.7 % (ref 11.5–15.5)
WBC: 7.7 10*3/uL (ref 4.0–10.5)

## 2017-11-29 LAB — BASIC METABOLIC PANEL
ANION GAP: 8 (ref 5–15)
BUN: 43 mg/dL — ABNORMAL HIGH (ref 6–20)
CHLORIDE: 103 mmol/L (ref 98–111)
CO2: 24 mmol/L (ref 22–32)
Calcium: 9.4 mg/dL (ref 8.9–10.3)
Creatinine, Ser: 0.84 mg/dL (ref 0.61–1.24)
GFR calc Af Amer: >60 mL/min (ref >60)
GFR calc non Af Amer: >60 mL/min (ref >60)
GLUCOSE: 110 mg/dL — AB (ref 70–99)
Potassium: 4.3 mmol/L (ref 3.5–5.1)
Sodium: 135 mmol/L (ref 135–145)

## 2017-11-29 LAB — SURGICAL PCR SCREEN
MRSA, PCR: NEGATIVE
Staphylococcus aureus: NEGATIVE

## 2017-11-29 NOTE — Progress Notes (Signed)
11-29-17 BMP result routed to Dr. Turner Daniels for review

## 2017-12-03 DIAGNOSIS — M1612 Unilateral primary osteoarthritis, left hip: Secondary | ICD-10-CM | POA: Diagnosis present

## 2017-12-03 NOTE — H&P (Signed)
TOTAL HIP ADMISSION H&P  Patient is admitted for left total hip arthroplasty.  Subjective:  Chief Complaint: left hip pain  HPI: Dylan Summers, 52 y.o. male, has a history of pain and functional disability in the left hip(s) due to arthritis and patient has failed non-surgical conservative treatments for greater than 12 weeks to include NSAID's and/or analgesics, use of assistive devices, weight reduction as appropriate and activity modification.  Onset of symptoms was gradual starting 4 years ago with gradually worsening course since that time.The patient noted no past surgery on the left hip(s).  Patient currently rates pain in the left hip at 10 out of 10 with activity. Patient has night pain, worsening of pain with activity and weight bearing, pain that interfers with activities of daily living and pain with passive range of motion. Patient has evidence of subchondral cysts, periarticular osteophytes and joint space narrowing by imaging studies. This condition presents safety issues increasing the risk of falls.  There is no current active infection.  Patient Active Problem List   Diagnosis Date Noted  . Exertional dyspnea 05/03/2015  . Alcoholic hepatitis 05/03/2015  . Hyponatremia 05/03/2015  . Macrocytic anemia 05/03/2015  . Thrombocytopenia (HCC) 05/03/2015  . Alcohol abuse 05/03/2015  . Dehydration 05/02/2015  . Hyperlipidemia   . Steatosis, liver 06/02/2013  . Tobacco abuse 06/01/2013  . Transaminitis 06/01/2013  . STEMI (ST elevation myocardial infarction) 05/31/13 05/31/2013  . HTN (hypertension) 05/31/2013  . Family history of coronary artery bypass surgery 05/31/2013  . Chest pain 05/31/2013   Past Medical History:  Diagnosis Date  . Arthritis    "left groin & right foot pains only when weather gets cold" (05/31/2013)  . Chest pain    a. 05/2013 Ant STEMI called but cath showed nl cors and EF 55-60%, Trop neg, D-dimer elevated->CTA  . HTN (hypertension)   .  Hyperlipidemia   . Steatosis, liver 06/02/2013    Past Surgical History:  Procedure Laterality Date  . CARDIAC CATHETERIZATION  05/31/2013   Normal but tortuous coronary arteries suggestive of hypertensive changes  . CLOSED REDUCTION SHOULDER DISLOCATION Left   . LEFT HEART CATHETERIZATION WITH CORONARY ANGIOGRAM N/A 05/31/2013   Procedure: LEFT HEART CATHETERIZATION WITH CORONARY ANGIOGRAM;  Surgeon: Iran Ouch, MD;  Location: [facility];  Service: Cardiovascular;  Laterality: N/A;    No current facility-administered medications for this encounter.    Current Outpatient Medications  Medication Sig Dispense Refill Last Dose  . carvedilol (COREG) 12.5 MG tablet Take 12.5 mg by mouth 2 (two) times daily with a meal.   Taking  . lisinopril-hydrochlorothiazide (PRINZIDE,ZESTORETIC) 20-25 MG tablet Take 1 tablet by mouth daily. 30 tablet 0 Taking  . Omega-3 Fatty Acids (FISH OIL) 1000 MG CAPS Take 1 capsule by mouth 3 (three) times daily.   Taking   No Known Allergies  Social History   Tobacco Use  . Smoking status: Former Smoker    Packs/day: 0.50    Years: 15.00    Pack years: 7.50    Types: Cigars    Last attempt to quit: 04/28/2003    Years since quitting: 14.6  . Smokeless tobacco: Never Used  Substance Use Topics  . Alcohol use: Yes    Alcohol/week: 10.0 - 12.0 standard drinks    Types: 10 - 12 Shots of liquor per week    Comment: 4 days per week    Family History  Problem Relation Age of Onset  . Coronary artery disease Mother  CABG  . Heart disease Mother   . Heart attack Mother   . Other Father        UNKNOWN     Review of Systems  Constitutional: Negative.   HENT: Negative.   Eyes: Negative.   Respiratory: Negative.   Cardiovascular:       HTN  Gastrointestinal: Negative.   Genitourinary: Negative.   Musculoskeletal: Positive for joint pain.  Skin: Negative.   Neurological: Negative.   Endo/Heme/Allergies: Negative.   Psychiatric/Behavioral:  Negative.     Objective:  Physical Exam  Constitutional: He is oriented to person, place, and time. He appears well-developed and well-nourished.  HENT:  Head: Normocephalic and atraumatic.  Eyes: Pupils are equal, round, and reactive to light.  Neck: Normal range of motion. Neck supple.  Cardiovascular: Intact distal pulses.  Respiratory: Effort normal.  Musculoskeletal: He exhibits tenderness.  Patient walks with a profound left-sided limp.  Any attempts at internal rotation causes severe pain.  He blocks at 0.  External rotation is limited to maybe 20, foot tap is mildly positive.  Skin over the hip is intact no erythema or swelling.  Neurological: He is alert and oriented to person, place, and time.  Skin: Skin is warm and dry.  Psychiatric: He has a normal mood and affect. His behavior is normal. Judgment and thought content normal.    Vital signs in last 24 hours:    Labs:   Estimated body mass index is 30.23 kg/m as calculated from the following:   Height as of 11/29/17: 5\' 7"  (1.702 m).   Weight as of 11/29/17: 87.5 kg.   Imaging Review Plain radiographs demonstrate AP pelvis and crosstable lateral show severe end-stage arthritis of the left hip with flattening of the femoral head subchondral cysts peripheral osteophytes and large drop osteophyte.    Preoperative templating of the joint replacement has been completed, documented, and submitted to the Operating Room personnel in order to optimize intra-operative equipment management.     Assessment/Plan:  End stage arthritis, left hip(s)  The patient history, physical examination, clinical judgement of the provider and imaging studies are consistent with end stage degenerative joint disease of the left hip(s) and total hip arthroplasty is deemed medically necessary. The treatment options including medical management, injection therapy, arthroscopy and arthroplasty were discussed at length. The risks and benefits of  total hip arthroplasty were presented and reviewed. The risks due to aseptic loosening, infection, stiffness, dislocation/subluxation,  thromboembolic complications and other imponderables were discussed.  The patient acknowledged the explanation, agreed to proceed with the plan and consent was signed. Patient is being admitted for inpatient treatment for surgery, pain control, PT, OT, prophylactic antibiotics, VTE prophylaxis, progressive ambulation and ADL's and discharge planning.The patient is planning to be discharged home with home health services.

## 2017-12-06 ENCOUNTER — Encounter (HOSPITAL_COMMUNITY): Payer: Self-pay | Admitting: Emergency Medicine

## 2017-12-06 ENCOUNTER — Inpatient Hospital Stay (HOSPITAL_COMMUNITY): Payer: Managed Care, Other (non HMO) | Admitting: Registered Nurse

## 2017-12-06 ENCOUNTER — Inpatient Hospital Stay (HOSPITAL_COMMUNITY): Payer: Managed Care, Other (non HMO)

## 2017-12-06 ENCOUNTER — Inpatient Hospital Stay (HOSPITAL_COMMUNITY)
Admission: RE | Admit: 2017-12-06 | Discharge: 2017-12-09 | DRG: 470 | Disposition: A | Discharge status: Home Health | Payer: Managed Care, Other (non HMO) | Attending: Orthopedic Surgery | Admitting: Orthopedic Surgery

## 2017-12-06 ENCOUNTER — Other Ambulatory Visit: Payer: Self-pay

## 2017-12-06 ENCOUNTER — Encounter (HOSPITAL_COMMUNITY)
Admission: RE | Disposition: A | Discharge status: Home Health | Payer: Self-pay | Source: Home / Self Care | Attending: Orthopedic Surgery

## 2017-12-06 DIAGNOSIS — E785 Hyperlipidemia, unspecified: Secondary | ICD-10-CM | POA: Diagnosis present

## 2017-12-06 DIAGNOSIS — D62 Acute posthemorrhagic anemia: Secondary | ICD-10-CM | POA: Diagnosis not present

## 2017-12-06 DIAGNOSIS — I252 Old myocardial infarction: Secondary | ICD-10-CM

## 2017-12-06 DIAGNOSIS — I10 Essential (primary) hypertension: Secondary | ICD-10-CM | POA: Diagnosis present

## 2017-12-06 DIAGNOSIS — Z7982 Long term (current) use of aspirin: Secondary | ICD-10-CM

## 2017-12-06 DIAGNOSIS — Z87891 Personal history of nicotine dependence: Secondary | ICD-10-CM | POA: Diagnosis not present

## 2017-12-06 DIAGNOSIS — M1612 Unilateral primary osteoarthritis, left hip: Principal | ICD-10-CM | POA: Diagnosis present

## 2017-12-06 DIAGNOSIS — I2510 Atherosclerotic heart disease of native coronary artery without angina pectoris: Secondary | ICD-10-CM | POA: Diagnosis present

## 2017-12-06 DIAGNOSIS — Z8249 Family history of ischemic heart disease and other diseases of the circulatory system: Secondary | ICD-10-CM | POA: Diagnosis not present

## 2017-12-06 HISTORY — DX: Atherosclerotic heart disease of native coronary artery without angina pectoris: I25.10

## 2017-12-06 HISTORY — PX: TOTAL HIP ARTHROPLASTY: SHX124

## 2017-12-06 LAB — SAMPLE TO BLOOD BANK

## 2017-12-06 SURGERY — ARTHROPLASTY, HIP, TOTAL, ANTERIOR APPROACH
Anesthesia: Spinal | Site: Hip | Laterality: Left

## 2017-12-06 MED ORDER — PROPOFOL 10 MG/ML IV BOLUS
INTRAVENOUS | Status: AC
  Filled 2017-12-06: qty 20

## 2017-12-06 MED ORDER — ONDANSETRON HCL 4 MG PO TABS
4.0000 mg | ORAL_TABLET | Freq: Four times a day (QID) | ORAL | Status: DC | PRN
Start: 2017-12-06 — End: 2017-12-09

## 2017-12-06 MED ORDER — PHENYLEPHRINE 40 MCG/ML (10ML) SYRINGE FOR IV PUSH (FOR BLOOD PRESSURE SUPPORT)
PREFILLED_SYRINGE | INTRAVENOUS | Status: DC | PRN
  Administered 2017-12-06 (×4): 80 ug via INTRAVENOUS

## 2017-12-06 MED ORDER — BUPIVACAINE-EPINEPHRINE (PF) 0.5% -1:200000 IJ SOLN
INTRAMUSCULAR | Status: DC | PRN
  Administered 2017-12-06: 20 mL

## 2017-12-06 MED ORDER — ALUM & MAG HYDROXIDE-SIMETH 200-200-20 MG/5ML PO SUSP: 15.0000 mL | ORAL | Status: DC | PRN

## 2017-12-06 MED ORDER — ACETAMINOPHEN 500 MG PO TABS
1000.0000 mg | ORAL_TABLET | Freq: Four times a day (QID) | ORAL | Status: AC
  Administered 2017-12-07 (×2): 1000 mg via ORAL
  Filled 2017-12-06: qty 2

## 2017-12-06 MED ORDER — FENTANYL CITRATE (PF) 100 MCG/2ML IJ SOLN
INTRAMUSCULAR | Status: DC | PRN
  Administered 2017-12-06 (×2): 50 ug via INTRAVENOUS

## 2017-12-06 MED ORDER — METHOCARBAMOL 500 MG PO TABS: 500.0000 mg | ORAL_TABLET | Freq: Four times a day (QID) | ORAL | Status: DC | PRN

## 2017-12-06 MED ORDER — CELECOXIB 200 MG PO CAPS
200.0000 mg | ORAL_CAPSULE | Freq: Two times a day (BID) | ORAL | Status: DC
  Administered 2017-12-06 – 2017-12-07 (×3): 200 mg via ORAL
  Filled 2017-12-06 (×3): qty 1

## 2017-12-06 MED ORDER — CARVEDILOL 12.5 MG PO TABS
12.5000 mg | ORAL_TABLET | Freq: Two times a day (BID) | ORAL | Status: DC
  Administered 2017-12-07 – 2017-12-09 (×4): 12.5 mg via ORAL
  Filled 2017-12-06 (×5): qty 1

## 2017-12-06 MED ORDER — KETOROLAC TROMETHAMINE 30 MG/ML IJ SOLN: 30.0000 mg | Freq: Once | INTRAMUSCULAR | Status: DC | PRN

## 2017-12-06 MED ORDER — PROPOFOL 500 MG/50ML IV EMUL
INTRAVENOUS | Status: DC | PRN
Start: 2017-12-06 — End: 2017-12-06
  Administered 2017-12-06: 40 ug/kg/min via INTRAVENOUS

## 2017-12-06 MED ORDER — ONDANSETRON HCL 4 MG/2ML IJ SOLN: 4.0000 mg | Freq: Four times a day (QID) | INTRAMUSCULAR | Status: DC | PRN

## 2017-12-06 MED ORDER — ONDANSETRON HCL 4 MG/2ML IJ SOLN
INTRAMUSCULAR | Status: DC | PRN
  Administered 2017-12-06: 4 mg via INTRAVENOUS

## 2017-12-06 MED ORDER — MEPERIDINE HCL 50 MG/ML IJ SOLN: 6.2500 mg | INTRAMUSCULAR | Status: DC | PRN

## 2017-12-06 MED ORDER — PANTOPRAZOLE SODIUM 40 MG PO TBEC
40.0000 mg | DELAYED_RELEASE_TABLET | Freq: Every day | ORAL | Status: DC
  Administered 2017-12-06 – 2017-12-07 (×2): 40 mg via ORAL
  Filled 2017-12-06 (×2): qty 1

## 2017-12-06 MED ORDER — SODIUM CHLORIDE 0.9 % IV SOLN
INTRAVENOUS | Status: DC | PRN
  Administered 2017-12-06: 25 ug/min via INTRAVENOUS

## 2017-12-06 MED ORDER — POLYETHYLENE GLYCOL 3350 17 G PO PACK
17.0000 g | PACK | Freq: Every day | ORAL | Status: DC | PRN
  Administered 2017-12-07: 17 g via ORAL
  Filled 2017-12-06: qty 1

## 2017-12-06 MED ORDER — ASPIRIN 81 MG PO CHEW
81.0000 mg | CHEWABLE_TABLET | Freq: Two times a day (BID) | ORAL | Status: DC
  Administered 2017-12-06 – 2017-12-09 (×6): 81 mg via ORAL
  Filled 2017-12-06 (×6): qty 1

## 2017-12-06 MED ORDER — ALUMINUM HYDROXIDE GEL 320 MG/5ML PO SUSP: 15.0000 mL | ORAL | Status: DC | PRN

## 2017-12-06 MED ORDER — MENTHOL 3 MG MT LOZG: 1.0000 | LOZENGE | OROMUCOSAL | Status: DC | PRN

## 2017-12-06 MED ORDER — PHENYLEPHRINE 40 MCG/ML (10ML) SYRINGE FOR IV PUSH (FOR BLOOD PRESSURE SUPPORT)
PREFILLED_SYRINGE | INTRAVENOUS | Status: AC
  Filled 2017-12-06: qty 10

## 2017-12-06 MED ORDER — PROPOFOL 10 MG/ML IV BOLUS
INTRAVENOUS | Status: AC
  Filled 2017-12-06: qty 40

## 2017-12-06 MED ORDER — MIDAZOLAM HCL 5 MG/5ML IJ SOLN
INTRAMUSCULAR | Status: DC | PRN
  Administered 2017-12-06: 2 mg via INTRAVENOUS

## 2017-12-06 MED ORDER — ONDANSETRON HCL 4 MG/2ML IJ SOLN
INTRAMUSCULAR | Status: AC
  Filled 2017-12-06: qty 2

## 2017-12-06 MED ORDER — DEXAMETHASONE SODIUM PHOSPHATE 10 MG/ML IJ SOLN
INTRAMUSCULAR | Status: AC
  Filled 2017-12-06: qty 1

## 2017-12-06 MED ORDER — BUPIVACAINE-EPINEPHRINE (PF) 0.5% -1:200000 IJ SOLN
INTRAMUSCULAR | Status: AC
  Filled 2017-12-06: qty 30

## 2017-12-06 MED ORDER — HYDROCHLOROTHIAZIDE 12.5 MG PO CAPS
25.0000 mg | ORAL_CAPSULE | Freq: Every day | ORAL | Status: DC
  Administered 2017-12-07 – 2017-12-08 (×2): 25 mg via ORAL
  Filled 2017-12-06 (×3): qty 2

## 2017-12-06 MED ORDER — HYDROMORPHONE HCL 1 MG/ML IJ SOLN: 0.2500 mg | INTRAMUSCULAR | Status: DC | PRN

## 2017-12-06 MED ORDER — SODIUM CHLORIDE 0.9% FLUSH
INTRAVENOUS | Status: DC | PRN
  Administered 2017-12-06: 50 mL

## 2017-12-06 MED ORDER — ACETAMINOPHEN 325 MG PO TABS
325.0000 mg | ORAL_TABLET | Freq: Four times a day (QID) | ORAL | Status: DC | PRN
  Administered 2017-12-09: 650 mg via ORAL
  Filled 2017-12-06: qty 2

## 2017-12-06 MED ORDER — DOCUSATE SODIUM 100 MG PO CAPS
100.0000 mg | ORAL_CAPSULE | Freq: Two times a day (BID) | ORAL | Status: DC
  Administered 2017-12-06 – 2017-12-09 (×6): 100 mg via ORAL
  Filled 2017-12-06 (×6): qty 1

## 2017-12-06 MED ORDER — TRANEXAMIC ACID 1000 MG/10ML IV SOLN
1000.0000 mg | Freq: Once | INTRAVENOUS | Status: AC
  Administered 2017-12-06: 1000 mg via INTRAVENOUS
  Filled 2017-12-06: qty 1000

## 2017-12-06 MED ORDER — EPHEDRINE SULFATE-NACL 50-0.9 MG/10ML-% IV SOSY
PREFILLED_SYRINGE | INTRAVENOUS | Status: DC | PRN
  Administered 2017-12-06: 10 mg via INTRAVENOUS
  Administered 2017-12-06: 5 mg via INTRAVENOUS

## 2017-12-06 MED ORDER — EPHEDRINE 5 MG/ML INJ
INTRAVENOUS | Status: AC
  Filled 2017-12-06: qty 10

## 2017-12-06 MED ORDER — LISINOPRIL-HYDROCHLOROTHIAZIDE 20-25 MG PO TABS: 1.0000 | ORAL_TABLET | Freq: Every day | ORAL | Status: DC

## 2017-12-06 MED ORDER — FLEET ENEMA 7-19 GM/118ML RE ENEM: 1.0000 | ENEMA | Freq: Once | RECTAL | Status: DC | PRN

## 2017-12-06 MED ORDER — PHENOL 1.4 % MT LIQD
1.0000 | OROMUCOSAL | Status: DC | PRN
  Filled 2017-12-06: qty 177

## 2017-12-06 MED ORDER — MIDAZOLAM HCL 2 MG/2ML IJ SOLN
INTRAMUSCULAR | Status: AC
  Filled 2017-12-06: qty 2

## 2017-12-06 MED ORDER — FENTANYL CITRATE (PF) 100 MCG/2ML IJ SOLN
INTRAMUSCULAR | Status: AC
Start: 2017-12-06 — End: ?
  Filled 2017-12-06: qty 2

## 2017-12-06 MED ORDER — BUPIVACAINE LIPOSOME 1.3 % IJ SUSP
20.0000 mL | Freq: Once | INTRAMUSCULAR | Status: DC
  Filled 2017-12-06: qty 20

## 2017-12-06 MED ORDER — OXYCODONE HCL 5 MG PO TABS: 5.0000 mg | ORAL_TABLET | ORAL | Status: DC | PRN

## 2017-12-06 MED ORDER — TRANEXAMIC ACID 1000 MG/10ML IV SOLN
2000.0000 mg | Freq: Once | INTRAVENOUS | Status: AC
  Administered 2017-12-06: 2000 mg via TOPICAL
  Filled 2017-12-06: qty 20

## 2017-12-06 MED ORDER — GABAPENTIN 300 MG PO CAPS
300.0000 mg | ORAL_CAPSULE | Freq: Three times a day (TID) | ORAL | Status: DC
  Administered 2017-12-06 – 2017-12-09 (×8): 300 mg via ORAL
  Filled 2017-12-06 (×7): qty 1

## 2017-12-06 MED ORDER — BUPIVACAINE LIPOSOME 1.3 % IJ SUSP
INTRAMUSCULAR | Status: DC | PRN
  Administered 2017-12-06: 20 mL

## 2017-12-06 MED ORDER — SODIUM CHLORIDE 0.9 % IV SOLN
1000.0000 mg | INTRAVENOUS | Status: AC
  Administered 2017-12-06: 1000 mg via INTRAVENOUS
  Filled 2017-12-06: qty 10

## 2017-12-06 MED ORDER — METOCLOPRAMIDE HCL 5 MG PO TABS: 5.0000 mg | ORAL_TABLET | Freq: Three times a day (TID) | ORAL | Status: DC | PRN

## 2017-12-06 MED ORDER — SODIUM CHLORIDE 0.9 % IJ SOLN
INTRAMUSCULAR | Status: AC
  Filled 2017-12-06: qty 50

## 2017-12-06 MED ORDER — LACTATED RINGERS IV SOLN
INTRAVENOUS | Status: DC
  Administered 2017-12-06: 11:00:00 via INTRAVENOUS

## 2017-12-06 MED ORDER — METOCLOPRAMIDE HCL 5 MG/ML IJ SOLN: 5.0000 mg | Freq: Three times a day (TID) | INTRAMUSCULAR | Status: DC | PRN

## 2017-12-06 MED ORDER — KCL IN DEXTROSE-NACL 20-5-0.45 MEQ/L-%-% IV SOLN
INTRAVENOUS | Status: DC
  Administered 2017-12-06: 19:00:00 via INTRAVENOUS
  Filled 2017-12-06 (×2): qty 1000

## 2017-12-06 MED ORDER — DEXAMETHASONE SODIUM PHOSPHATE 10 MG/ML IJ SOLN
10.0000 mg | Freq: Once | INTRAMUSCULAR | Status: AC
  Administered 2017-12-07: 10 mg via INTRAVENOUS
  Filled 2017-12-06: qty 1

## 2017-12-06 MED ORDER — DEXAMETHASONE SODIUM PHOSPHATE 10 MG/ML IJ SOLN
INTRAMUSCULAR | Status: DC | PRN
  Administered 2017-12-06: 8 mg via INTRAVENOUS

## 2017-12-06 MED ORDER — 0.9 % SODIUM CHLORIDE (POUR BTL) OPTIME
TOPICAL | Status: DC | PRN
  Administered 2017-12-06: 1000 mL

## 2017-12-06 MED ORDER — METHOCARBAMOL 500 MG IVPB - SIMPLE MED
500.0000 mg | Freq: Four times a day (QID) | INTRAVENOUS | Status: DC | PRN
  Filled 2017-12-06: qty 50

## 2017-12-06 MED ORDER — PROPOFOL 10 MG/ML IV BOLUS
INTRAVENOUS | Status: AC
Start: 2017-12-06 — End: ?
  Filled 2017-12-06: qty 20

## 2017-12-06 MED ORDER — LISINOPRIL 20 MG PO TABS
20.0000 mg | ORAL_TABLET | Freq: Every day | ORAL | Status: DC
  Administered 2017-12-07 – 2017-12-08 (×2): 20 mg via ORAL
  Filled 2017-12-06 (×3): qty 1

## 2017-12-06 MED ORDER — CEFAZOLIN SODIUM-DEXTROSE 2-4 GM/100ML-% IV SOLN
2.0000 g | Freq: Once | INTRAVENOUS | Status: AC
  Administered 2017-12-06: 2 g via INTRAVENOUS

## 2017-12-06 MED ORDER — CEFAZOLIN SODIUM-DEXTROSE 2-4 GM/100ML-% IV SOLN
INTRAVENOUS | Status: AC
  Filled 2017-12-06: qty 100

## 2017-12-06 MED ORDER — HYDROMORPHONE HCL 1 MG/ML IJ SOLN: 0.5000 mg | INTRAMUSCULAR | Status: DC | PRN

## 2017-12-06 MED ORDER — DIPHENHYDRAMINE HCL 12.5 MG/5ML PO ELIX: 12.5000 mg | ORAL_SOLUTION | ORAL | Status: DC | PRN

## 2017-12-06 MED ORDER — PROMETHAZINE HCL 25 MG/ML IJ SOLN: 6.2500 mg | INTRAMUSCULAR | Status: DC | PRN

## 2017-12-06 MED ORDER — BISACODYL 5 MG PO TBEC: 5.0000 mg | DELAYED_RELEASE_TABLET | Freq: Every day | ORAL | Status: DC | PRN

## 2017-12-06 SURGICAL SUPPLY — 44 items
BAG DECANTER FOR FLEXI CONT (MISCELLANEOUS) ×3 IMPLANT
BLADE SAW SGTL 18X1.27X75 (BLADE) ×2 IMPLANT
BLADE SAW SGTL 18X1.27X75MM (BLADE) ×1
COVER PERINEAL POST (MISCELLANEOUS) ×3 IMPLANT
COVER SURGICAL LIGHT HANDLE (MISCELLANEOUS) ×3 IMPLANT
DRAPE STERI IOBAN 125X83 (DRAPES) ×3 IMPLANT
DRAPE U-SHAPE 47X51 STRL (DRAPES) ×6 IMPLANT
DRSG AQUACEL AG ADV 3.5X10 (GAUZE/BANDAGES/DRESSINGS) ×3 IMPLANT
DURAPREP 26ML APPLICATOR (WOUND CARE) ×3 IMPLANT
ELECT REM PT RETURN 9FT ADLT (ELECTROSURGICAL) ×3
ELECTRODE REM PT RTRN 9FT ADLT (ELECTROSURGICAL) ×1 IMPLANT
ELIMINATOR HOLE APEX DEPUY (Hips) ×2 IMPLANT
GLOVE BIO SURGEON STRL SZ7.5 (GLOVE) ×3 IMPLANT
GLOVE BIO SURGEON STRL SZ8.5 (GLOVE) ×1 IMPLANT
GLOVE BIOGEL PI IND STRL 7.0 (GLOVE) IMPLANT
GLOVE BIOGEL PI IND STRL 8 (GLOVE) ×1 IMPLANT
GLOVE BIOGEL PI IND STRL 9 (GLOVE) ×1 IMPLANT
GLOVE BIOGEL PI INDICATOR 7.0 (GLOVE) ×12
GLOVE BIOGEL PI INDICATOR 8 (GLOVE) ×2
GLOVE BIOGEL PI INDICATOR 9 (GLOVE)
GOWN STRL REUS W/ TWL LRG LVL3 (GOWN DISPOSABLE) ×1 IMPLANT
GOWN STRL REUS W/ TWL XL LVL3 (GOWN DISPOSABLE) ×1 IMPLANT
GOWN STRL REUS W/TWL LRG LVL3 (GOWN DISPOSABLE) ×3
GOWN STRL REUS W/TWL XL LVL3 (GOWN DISPOSABLE) ×9
HEAD CERAMIC 36 PLUS 8.5 12 14 (Hips) ×2 IMPLANT
MANIFOLD NEPTUNE II (INSTRUMENTS) ×3 IMPLANT
NEEDLE HYPO 22GX1.5 SAFETY (NEEDLE) ×6 IMPLANT
PACK ANTERIOR HIP CUSTOM (KITS) ×3 IMPLANT
PIN SECT CUP 56MM (Hips) ×2 IMPLANT
PINNACLE ALTRX PLUS 4 N 36X56 (Hips) ×2 IMPLANT
STEM TRI LOC BPS SZ6 W GRIPTON IMPLANT
SUT ETHIBOND NAB CT1 #1 30IN (SUTURE) ×3 IMPLANT
SUT VIC AB 0 CT1 27 (SUTURE)
SUT VIC AB 0 CT1 27XBRD ANBCTR (SUTURE) IMPLANT
SUT VIC AB 1 CTX 36 (SUTURE) ×3
SUT VIC AB 1 CTX36XBRD ANBCTR (SUTURE) ×1 IMPLANT
SUT VIC AB 2-0 CT1 27 (SUTURE) ×3
SUT VIC AB 2-0 CT1 TAPERPNT 27 (SUTURE) ×1 IMPLANT
SUT VIC AB 3-0 CT1 27 (SUTURE) ×3
SUT VIC AB 3-0 CT1 TAPERPNT 27 (SUTURE) ×1 IMPLANT
SYR CONTROL 10ML LL (SYRINGE) ×9 IMPLANT
TRAY FOLEY CATH 16FR SILVER (SET/KITS/TRAYS/PACK) ×2 IMPLANT
TRI LOC BPS SZ 6 W GRIPTON ×3 IMPLANT
YANKAUER SUCT BULB TIP 10FT TU (MISCELLANEOUS) ×3 IMPLANT

## 2017-12-06 NOTE — Interval H&P Note (Signed)
History and Physical Interval Note:  12/06/2017 11:43 AM  Dylan Summers  has presented today for surgery, with the diagnosis of LEFT HIP OSTEOARTHRITIS  The various methods of treatment have been discussed with the patient and family. After consideration of risks, benefits and other options for treatment, the patient has consented to  Procedure(s) with comments: LEFT TOTAL HIP ARTHROPLASTY ANTERIOR APPROACH (Left) - Needs RNFA as a surgical intervention .  The patient's history has been reviewed, patient examined, no change in status, stable for surgery.  I have reviewed the patient's chart and labs.  Questions were answered to the patient's satisfaction.     [name]

## 2017-12-06 NOTE — Anesthesia Preprocedure Evaluation (Signed)
Anesthesia Evaluation  Patient identified by MRN, date of birth, ID band Patient awake    Reviewed: Allergy & Precautions, NPO status , Patient's Chart, lab work & pertinent test results, reviewed documented beta blocker date and time   Airway Mallampati: I       Dental no notable dental hx. (+) Teeth Intact   Pulmonary former smoker,    Pulmonary exam normal breath sounds clear to auscultation       Cardiovascular hypertension, Pt. on medications and Pt. on home beta blockers + Past MI  Normal cardiovascular exam Rhythm:Regular Rate:Normal     Neuro/Psych    GI/Hepatic (+) Hepatitis -, Unspecified  Endo/Other  negative endocrine ROS  Renal/GU      Musculoskeletal negative musculoskeletal ROS (+) Arthritis , Osteoarthritis,    Abdominal Normal abdominal exam  (+)   Peds  Hematology  (+) anemia ,   Anesthesia Other Findings [name]  ECHO COMPLETE WO IMAGING ENHANCING AGENT  Order# [id number]  Reading physician: Wendall Stade, MD Ordering physician: Eduard Clos, MD Study date: 05/03/15 Study Result   Result status: Final result                             *[name]*                   *[name]*                         [address]                        [place], [place] [address]                            [phone]  ------------------------------------------------------------------- Transthoracic Echocardiography  Patient:    Dylan Summers, Dylan Summers MR #:       [id number] Study Date: 05/03/2015 Gender:     M Age:        52 Height:     170.2 cm Weight:     80.3 kg BSA:        1.97 m^2 Pt. Status: Room:       5W20C   ADMITTING    Mickel Fuchs N  SONOGRAPHER  Delcie Roch, RDCS, CCT  ATTENDING    Joseph Art  PERFORMING   [facility],  Inpatient  cc:  ------------------------------------------------------------------- LV EF: 60% -   65%  ------------------------------------------------------------------- Indications:      Dyspnea 786.09.  ------------------------------------------------------------------- History:   Risk factors:  Alcohol abuse. Hypertension.  ------------------------------------------------------------------- Study Conclusions  - Left ventricle: The cavity size was normal. Systolic function was   normal. The estimated ejection fraction was in the range of 60%   to 65%. Wall motion was normal; there were no regional wall   motion abnormalities. Doppler parameters are consistent with   abnormal left ventricular relaxation (grade 1 diastolic   dysfunction). - Atrial septum: No defect or patent foramen ovale was identified.  Transthoracic echocardiography.  M-mode, complete 2D, spectral Doppler, and color Doppler.  Birthdate:  Patient birthdate: May 10, 1965.  Age:  Patient is 52 yr old. old.  Sex:  Gender: male. BMI: 27.7 kg/m^2.  Blood pressure:     132/82  Patient status: Inpatient.  Study date:  Study date: 05/03/2015. Study  time: 02:58 PM.  Location:  Echo laboratory.  -------------------------------------------------------------------  ------------------------------------------------------------------- Left ventricle:  The cavity size was normal. Systolic function was normal. The estimated ejection fraction was in the range of 60% to 65%. Wall motion was normal; there were no regional wall motion abnormalities. Doppler parameters are consistent with abnormal left ventricular relaxation (grade 1 diastolic dysfunction).  ------------------------------------------------------------------- Aortic valve:   Trileaflet; normal thickness leaflets. Mobility was not restricted.  Doppler:  Transvalvular velocity was within the normal range. There was no stenosis. There was no regurgitation.    ------------------------------------------------------------------- Aorta:  The aorta was normal, not dilated, and non-diseased. Aortic root: The aortic root was normal in size.  ------------------------------------------------------------------- Mitral valve:   Structurally normal valve.   Mobility was not restricted.  Doppler:  Transvalvular velocity was within the normal range. There was no evidence for stenosis. There was trivial regurgitation.    Peak gradient (D): 2 mm Hg.  ------------------------------------------------------------------- Left atrium:  The atrium was normal in size.  ------------------------------------------------------------------- Atrial septum:  No defect or patent foramen ovale was identified.   ------------------------------------------------------------------- Right ventricle:  The cavity size was normal. Wall thickness was normal. Systolic function was normal.  ------------------------------------------------------------------- Pulmonic valve:    Doppler:  Transvalvular velocity was within the normal range. There was no evidence for stenosis. There was trivial regurgitation.  ------------------------------------------------------------------- Tricuspid valve:   Structurally normal valve.    Doppler: Transvalvular velocity was within the normal range. There was mild regurgitation.  ------------------------------------------------------------------- Pulmonary artery:   The main pulmonary artery was normal-sized. Systolic pressure was within the normal range.  ------------------------------------------------------------------- Right atrium:  The atrium was normal in size.  ------------------------------------------------------------------- Pericardium:  The pericardium was normal in appearance. There was no pericardial effusion.  ------------------------------------------------------------------- Systemic veins: Inferior vena cava:  The vessel was normal in size.  ------------------------------------------------------------------- Post procedure conclusions Ascending Aorta:  - The aorta was normal, not dilated, and non-diseased.  ------------------------------------------------------------------- Measurements   Left ventricle                         Value        Reference  LV ID, ED, PLAX chordal                43.5  mm     43 - 52  LV ID, ES, PLAX chordal                30.7  mm     23 - 38  LV fx shortening, PLAX chordal         29    %      >=29  LV PW thickness, ED                    8.31  mm     ---------  IVS/LV PW ratio, ED                    1.06         <=1.3  LV e&', lateral                         10.3  cm/s   ---------  LV E/e&', lateral                       7.23         ---------  LV e&', medial  8.52  cm/s   ---------  LV E/e&', medial                        8.74         ---------  LV e&', average                         9.41  cm/s   ---------  LV E/e&', average                       7.92         ---------    Ventricular septum                     Value        Reference  IVS thickness, ED                      8.79  mm     ---------    LVOT                                   Value        Reference  LVOT ID, S                             24    mm     ---------  LVOT area                              4.52  cm^2   ---------    Aorta                                  Value        Reference  Aortic root ID, ED                     36    mm     ---------    Left atrium                            Value        Reference  LA ID, A-P, ES                         36.15 mm     ---------  LA ID/bsa, A-P                         1.84  cm/m^2 <=2.2  LA volume, S                           57    ml     ---------  LA volume/bsa, S                       29    ml/m^2 ---------  LA volume, ES, 1-p A4C                 36    ml     ---------  LA volume/bsa, ES, 1-p A4C             18.3   ml/m^2 ---------  LA volume, ES, 1-p A2C                 81    ml     ---------  LA volume/bsa, ES, 1-p A2C             41.2  ml/m^2 ---------    Mitral valve                           Value        Reference  Mitral E-wave peak velocity            74.5  cm/s   ---------  Mitral A-wave peak velocity            98.7  cm/s   ---------  Mitral peak gradient, D                2     mm Hg  ---------  Mitral E/A ratio, peak                 1.2          ---------    Systemic veins                         Value        Reference  Estimated CVP                          3     mm Hg  ---------    Right ventricle                        Value        Reference  RV s&', lateral, S                      15    cm/s   ---------  Legend: (L)  and  (H)  mark values outside specified reference range.  ------------------------------------------------------------------- Prepared and Electronically Authenticated by  Charlton Haws, M.D. 2017-01-06T15:59:57 MERGE Images   Show images for ECHO COMPLETE Patient Information   Patient Name Dylan Summers, Dylan Summers Sex Male DOB 10-Apr-1966 SSN [id number] Reason for Exam  Priority: Routine  Dyspnea 786.09 / R06.00 Comments:  Surgical History   Surgical History    Procedure Laterality Date Comment Source CARDIAC CATHETERIZATION  05/31/2013 Normal but tortuous coronary arteries suggestive of hypertensive changes Provider  Other Surgical History    Procedure Laterality Date Comment Source CLOSED REDUCTION SHOULDER DISLOCATION Left   Provider LEFT HEART CATHETERIZATION WITH CORONARY ANGIOGRAM N/A 05/31/2013 Procedure: LEFT HEART CATHETERIZATION WITH CORONARY ANGIOGRAM; Surgeon: Iran Ouch, MD; Location: [facility]; Service: Cardiovascular; Laterality: N/A; Provider  Performing Technologist/Nurse   Performing Technologist/Nurse: Jacqlyn Krauss, RDCS          Implants    No active implants to display in this view. Order-Level  Documents - 05/02/2015:   Scan on 05/03/2015 12:07 PM by Default, Provider, MDScan on 05/03/2015 12:07 PM by Default, Provider, MD    Encounter-Level Documents - 05/02/2015:   Scan on 05/08/2015 11:05 AM by Default, Provider, MDScan on 05/08/2015 11:05 AM by Default, Provider, MD  Scan on 05/07/2015 11:37 AM by Default, Provider, MDScan on  05/07/2015 11:37 AM by Default, Provider, MD  Scan on 05/05/2015 9:02 AM by Default, Provider, MDScan on 05/05/2015 9:02 AM by Default, Provider, MD  Electronic signature on 05/02/2015 8:31 PM - Signed  Scan on 05/10/2015 10:00 AM by Adora Fridge K: faxedScan on 05/10/2015 10:00 AM by Wendie Simmer: faxed    Signed   Electronically signed by Wendall Stade, MD on 05/03/15 at 1559 EST Printable Result Report    Result Report  External Result Report    External Result Report     Reproductive/Obstetrics                             Anesthesia Physical Anesthesia Plan  ASA: II  Anesthesia Plan: Spinal   Post-op Pain Management:    Induction:   PONV Risk Score and Plan: 2 and Ondansetron and Dexamethasone  Airway Management Planned: Natural Airway, Nasal Cannula and Simple Face Mask  Additional Equipment:   Intra-op Plan:   Post-operative Plan:   Informed Consent: I have reviewed the patients History and Physical, chart, labs and discussed the procedure including the risks, benefits and alternatives for the proposed anesthesia with the patient or authorized representative who has indicated his/her understanding and acceptance.     Plan Discussed with: CRNA and Surgeon  Anesthesia Plan Comments:         Anesthesia Quick Evaluation

## 2017-12-06 NOTE — Transfer of Care (Signed)
Immediate Anesthesia Transfer of Care Note  Patient: Dylan Summers  Procedure(s) Performed: LEFT TOTAL HIP ARTHROPLASTY ANTERIOR APPROACH (Left Hip)  Patient Location: PACU  Anesthesia Type:Spinal  Level of Consciousness: awake, alert , oriented and patient cooperative  Airway & Oxygen Therapy: Patient Spontanous Breathing and Patient connected to face mask oxygen  Post-op Assessment: Report given to RN, Post -op Vital signs reviewed and stable and Patient moving all extremities X 4  Post vital signs: stable  Last Vitals:  Vitals Value Taken Time  BP 113/71 12/06/2017  3:15 PM  Temp    Pulse 80 12/06/2017  3:19 PM  Resp 14 12/06/2017  3:19 PM  SpO2 100 % 12/06/2017  3:19 PM  Vitals shown include unvalidated device data.  Last Pain:  Vitals:   12/06/17 1042  TempSrc:   PainSc: 0-No pain      Patients Stated Pain Goal: 4 (12/06/17 1042)  Complications: No apparent anesthesia complications

## 2017-12-06 NOTE — Op Note (Addendum)
OPERATIVE REPORT    DATE OF PROCEDURE:  12/06/2017       PREOPERATIVE DIAGNOSIS:  LEFT HIP OSTEOARTHRITIS                                                          POSTOPERATIVE DIAGNOSIS:  LEFT HIP OSTEOARTHRITIS                                                           PROCEDURE: Anterior L total hip arthroplasty using a 56 mm DePuy Pinnacle  Cup, [employer], 0-degree polyethylene liner, a +8.5x36 mm ceramic head, a Engineer, manufacturing systems stem   SURGEON: [name]    ASSISTANT:   Kierstin Schrader OPA  (present throughout entire procedure and necessary for timely completion of the procedure)   ANESTHESIA: Spinal BLOOD LOSS: 350cc FLUID REPLACEMENT: 1500cc crystalloid Antibiotic: 2gm ancef Tranexamic Acid: 1gm IV, 2gm Topical COMPLICATIONS: none    INDICATIONS FOR PROCEDURE: A 52 y.o. year-old With  LEFT HIP OSTEOARTHRITIS   for 5 years, x-rays show bone-on-bone arthritic changes, and osteophytes. Despite conservative measures with observation, anti-inflammatory medicine, narcotics, use of a cane, has severe unremitting pain and can ambulate only a few blocks before resting. Patient desires elective L total hip arthroplasty to decrease pain and increase function. The risks, benefits, and alternatives were discussed at length including but not limited to the risks of infection, bleeding, nerve injury, stiffness, blood clots, the need for revision surgery, cardiopulmonary complications, among others, and they were willing to proceed. Questions answered     PROCEDURE IN DETAIL: The patient was identified by armband,  received preoperative IV antibiotics in the holding area at [facility], taken to the operating room , appropriate anesthetic monitors  were attached and  anesthesia was induced with the patienton the gurney. The HANA boots were applied to the feet and he was then transferred to the HANA table with a peroneal post and support underneath the non-operative  le, which was locked in 5 lb traction. Theoperative lower extremity was then prepped and draped in the usual sterile fashion from just above the iliac crest to the knee. And a timeout procedure was performed. We then made a 12 cm incision along the interval at the leading edge of the tensor fascia lata of starting at 2 cm lateral to and 2 cm distal to the ASIS. Small bleeders in the skin and subcutaneous tissue identified and cauterized we dissected down to the fascia and made an incision in the fascia allowing [place] to elevate the fascia of the tensor muscle and exploited the interval between the rectus and the tensor fascia lata. A Hohmann retractor was then placed along the superior neck of the femur and a Cobra retractor along the inferior neck of the femur we teed the capsule starting out at the superior anterior aspect of the acetabulum going distally and made the T along the neck both leaflets of the T were tagged with #2 Ethibond suture. Cobra retractors were then placed along the inferior and superior neck allowing [place] to perform a standard neck cut and removed the  femoral head with a power corkscrew. We then placed a right angle Hohmann retractor along the anterior aspect of the acetabulum a spiked Cobra in the cotyloid notch and posteriorly a Muelller retractor. We then sequentially reamed up to a 55 mm basket reamer obtaining good coverage in all quadrants, verified by C-arm imaging. Under C-arm control with and hammered into place a 56 mm Pinnacle cup in 45 of abduction and 15 of anteversion. The cup seated nicely and required no supplemental screws. We then placed a central hole Eliminator and a 0 polyethylene liner. The foot was then externally rotated to 110, the HANA elevator was placed around the flare of the greater trochanter and the limb was extended and abducted delivering the proximal femur up into the wound. A medium Hohmann retractor was placed over the greater trochanter and a Mueller  retractor along the posterior femoral neck completing the exposure. We then performed releases superiorly and and inferiorly of the capsule going back to the pirformis fossa superiorly and to the lesser trochanter inferiorly. We then entered the proximal femur with the box cutting offset chisel followed by, a canal sounder, the chili pepper and broaching up to a 6 broach. This seated nicely and we reamed the calcar. A trial reduction was performed with a 8.5 mm 36 mm head.The limb lengths were excellent the hip was stable in 90 of external rotation. At this point the trial components removed and we hammered into place a # 6 hi  Offset Tri-Lock stem with Gryption coating. A + 8.5x36 mm ceramic ball was then hammered into place the hip was reduced and final C-arm images obtained. The wound was thoroughly irrigated with normal saline solution. We repaired the ant capsule and the tensor fascia lot a with running 0 vicryl suture. the subcutaneous tissue was closed with 2-0 and 3-0 Vicryl suture followed by an Aquacil dressing. At this point the patient was awaken and transferred to hospital gurney without difficulty. The subcutaneous tissue with 0 and 2-0 undyed Vicryl suture and the skin with running  3-0 vicryl subcuticular suture. Aquacil dressing was applied. The patient was then unclamped, rolled supine, awaken extubated and taken to recovery room without difficulty in stable condition.   [name] 12/06/2017, 2:38 PM

## 2017-12-06 NOTE — Progress Notes (Signed)
Bank card and four dollars placed in pt valuable envelope and locked with security. Receipt and key placed in pt hard chart. Pt verbalizes understanding of where his valuables are located.

## 2017-12-07 ENCOUNTER — Encounter (HOSPITAL_COMMUNITY): Payer: Self-pay | Admitting: Orthopedic Surgery

## 2017-12-07 LAB — BASIC METABOLIC PANEL
ANION GAP: 8 (ref 5–15)
BUN: 23 mg/dL — AB (ref 6–20)
CALCIUM: 8.5 mg/dL — AB (ref 8.9–10.3)
CO2: 22 mmol/L (ref 22–32)
CREATININE: 0.81 mg/dL (ref 0.61–1.24)
Chloride: 101 mmol/L (ref 98–111)
GFR calc Af Amer: >60 mL/min (ref >60)
Glucose, Bld: 183 mg/dL — ABNORMAL HIGH (ref 70–99)
Potassium: 4.6 mmol/L (ref 3.5–5.1)
Sodium: 131 mmol/L — ABNORMAL LOW (ref 135–145)

## 2017-12-07 LAB — CBC
HCT: 24.1 % — ABNORMAL LOW (ref 39.0–52.0)
Hemoglobin: 8.5 g/dL — ABNORMAL LOW (ref 13.0–17.0)
MCH: 35.1 pg — ABNORMAL HIGH (ref 26.0–34.0)
MCHC: 35.3 g/dL (ref 30.0–36.0)
MCV: 99.6 fL (ref 78.0–100.0)
PLATELETS: 72 10*3/uL — AB (ref 150–400)
RBC: 2.42 MIL/uL — ABNORMAL LOW (ref 4.22–5.81)
RDW: 12.8 % (ref 11.5–15.5)
WBC: 9.8 10*3/uL (ref 4.0–10.5)

## 2017-12-07 MED ORDER — ASPIRIN EC 81 MG PO TBEC: 81.0000 mg | DELAYED_RELEASE_TABLET | Freq: Every day | ORAL | 2 refills | Status: AC

## 2017-12-07 MED ORDER — SODIUM CHLORIDE 0.9 % IV BOLUS
500.0000 mL | Freq: Once | INTRAVENOUS | Status: AC
  Administered 2017-12-07: 500 mL via INTRAVENOUS

## 2017-12-07 MED ORDER — TIZANIDINE HCL 2 MG PO CAPS: 4.0000 mg | ORAL_CAPSULE | Freq: Three times a day (TID) | ORAL | 0 refills | Status: DC | PRN

## 2017-12-07 MED ORDER — OXYCODONE HCL 5 MG PO TABS: 5.0000 mg | ORAL_TABLET | ORAL | 0 refills | Status: AC | PRN

## 2017-12-07 NOTE — Anesthesia Postprocedure Evaluation (Signed)
Anesthesia Post Note  Patient: Dylan Summers  Procedure(s) Performed: LEFT TOTAL HIP ARTHROPLASTY ANTERIOR APPROACH (Left Hip)     Patient location during evaluation: PACU Anesthesia Type: Spinal Level of consciousness: awake Pain management: pain level controlled Vital Signs Assessment: post-procedure vital signs reviewed and stable Respiratory status: spontaneous breathing Cardiovascular status: stable Postop Assessment: no headache, no backache, patient able to bend at knees, spinal receding and no apparent nausea or vomiting Anesthetic complications: no    Last Vitals:  Vitals:   12/07/17 1447 12/07/17 1738  BP: (!) 75/51 115/66  Pulse: 92 78  Resp: 16   Temp: 36.8 C   SpO2: 100%     Last Pain:  Vitals:   12/07/17 2033  TempSrc:   PainSc: 0-No pain   Pain Goal: Patients Stated Pain Goal: 1 (12/07/17 0806)               Jamarius Saha JR,JOHN Susann Givens

## 2017-12-07 NOTE — Progress Notes (Signed)
PA called earlier this afternoon with concern about patient's blood pressure, and about going home. PA, Greig Castilla ordered bolus for patient, and concelled discharge.   RN assessed patient after NT obtained low BP reading. Patient denied shortness of breath, chest pain, or dizziness. Surgical site negative for signs of increased bleeding.   Some bleeding noted this AM upon AM assessment that night RN stated was present on her assessment.

## 2017-12-07 NOTE — Care Management Note (Addendum)
Case Management Note  Patient Details  Name: Dylan Summers MRN: [id number] Date of Birth: [birth year]  Subjective/Objective:         Patient has no preference for [facility] agency           Action/Plan: Contacted [facility] for referral, they have accepted. No DME needs.   Expected Discharge Date:                  Expected Discharge Plan:  Home w Home Health Services  In-House Referral:  NA  Discharge planning Services  CM Consult  Post Acute Care Choice:  Home Health Choice offered to:  Patient  DME Arranged:  N/A DME Agency:  NA  HH Arranged:  PT HH Agency:  [facility]  Status of Service:  Completed, signed off  If discussed at Long Length of Stay Meetings, dates discussed:    Additional Comments:  Alexis Goodell, RN 12/07/2017, 12:21 PM

## 2017-12-07 NOTE — Plan of Care (Signed)
No changes to care plans needed at this time. Pt stable at time of assessment.

## 2017-12-07 NOTE — Progress Notes (Signed)
Physical Therapy Treatment Patient Details Name: Dylan Summers MRN: [id number] DOB: 1965-09-30 Today's Date: 12/07/2017    History of Present Illness L Direct anterior THA    PT Comments    The patient's BP has been low while in recliner, 75/51. No complaints of dizziness. . Monitored BP and symptoms. BP post ambulation 118/69, HR 114. RN aware. Patient to get a  Bolus and DC planned for tomorrow.  Follow Up Recommendations  Home health PT;Follow surgeon's recommendation for DC plan and follow-up therapies     Equipment Recommendations  Rolling walker with 5" wheels;3in1 (PT)    Recommendations for Other Services       Precautions / Restrictions Precautions Precautions: Fall Precaution Comments: low BP this PM    Mobility  Bed Mobility Overal bed mobility: Needs Assistance Bed Mobility: Sit to Supine;Supine to Sit     Supine to sit: Supervision Sit to supine: Supervision   General bed mobility comments: used  right foot under left foot to self assist  Transfers Overall transfer level: Needs assistance Equipment used: Rolling walker (2 wheeled) Transfers: Sit to/from Stand Sit to Stand: Supervision         General transfer comment: cues for hand and left leg position  Ambulation/Gait Ambulation/Gait assistance: Supervision Gait Distance (Feet): 300 Feet Assistive device: Rolling walker (2 wheeled) Gait Pattern/deviations: Step-through pattern     General Gait Details: cues for sequence   Stairs             Wheelchair Mobility    Modified Rankin (Stroke Patients Only)       Balance                                            Cognition Arousal/Alertness: Awake/alert                                            Exercises Total Joint Exercises Ankle Circles/Pumps: AROM;Left;10 reps;Supine Quad Sets: AROM;Both;10 reps;Supine Short Arc Quad: AROM;Left;10 reps;Supine Heel Slides: AAROM;Left;10  reps;Supine Hip ABduction/ADduction: AROM;Left;10 reps Long Arc Quad: AROM;10 reps;Left    General Comments        Pertinent Vitals/Pain Pain Location: left thigh Pain Descriptors / Indicators: Sore Pain Intervention(s): Monitored during session;Ice applied    Home Living                      Prior Function            PT Goals (current goals can now be found in the care plan section) Progress towards PT goals: Progressing toward goals    Frequency    7X/week      PT Plan Current plan remains appropriate    Co-evaluation              AM-PAC PT "6 Clicks" Daily Activity  Outcome Measure  Difficulty turning over in bed (including adjusting bedclothes, sheets and blankets)?: A Little Difficulty moving from lying on back to sitting on the side of the bed? : A Little Difficulty sitting down on and standing up from a chair with arms (e.g., wheelchair, bedside commode, etc,.)?: A Little Help needed moving to and from a bed to chair (including a wheelchair)?: A Little Help needed walking in hospital room?: A  Little Help needed climbing 3-5 steps with a railing? : A Little 6 Click Score: 18    End of Session Equipment Utilized During Treatment: Gait belt Activity Tolerance: Patient tolerated treatment well Patient left: with call bell/phone within reach(in BR) Nurse Communication: Mobility status PT Visit Diagnosis: Unsteadiness on feet (R26.81)     Time: 9147-8295 PT Time Calculation (min) (ACUTE ONLY): 26 min  Charges:  $Gait Training: 8-22 mins $Therapeutic Exercise: 8-22 mins                     [place] PT [phone]    Rada Hay 12/07/2017, 5:02 PM

## 2017-12-07 NOTE — Progress Notes (Signed)
PATIENT ID: Dylan Summers  MRN: [id number]  DOB/AGE:  1965/10/28 / 52 y.o.  1 Day Post-Op Procedure(s) (LRB): LEFT TOTAL HIP ARTHROPLASTY ANTERIOR APPROACH (Left)    PROGRESS NOTE Subjective: Patient is alert, oriented, no Nausea, no Vomiting, yes passing gas, . Taking PO well. Denies SOB, Chest or Calf Pain. Using Incentive Spirometer, PAS in place. Ambulate WBAT, not out of bed yet Patient reports pain as  0/10  .    Objective: Vital signs in last 24 hours: Vitals:   12/06/17 1949 12/06/17 2037 12/07/17 0151 12/07/17 0620  BP: 136/77 136/79 (Abnormal) 117/95 132/84  Pulse: 87 85 95 87  Resp: 16 16 16 16   Temp: 98.6 F (37 C) 98.5 F (36.9 C) 98.3 F (36.8 C) 98.1 F (36.7 C)  TempSrc: Oral Oral Oral Oral  SpO2: 100% 99% 100% 100%  Weight:      Height:          Intake/Output from previous day: I/O last 3 completed shifts: In: 33 [P.O.:474; I.V.:2900] Out: 550 [Urine:350; Blood:200]   Intake/Output this shift: Total I/O In: 1188 [P.O.:560; I.V.:628] Out: 400 [Urine:400]   LABORATORY DATA: Recent Labs    12/07/17 0509  WBC 9.8  HGB 8.5*  HCT 24.1*  PLT 72*  NA 131*  K 4.6  CL 101  CO2 22  BUN 23*  CREATININE 0.81  GLUCOSE 183*  CALCIUM 8.5*    Examination: Neurologically intact ABD soft Neurovascular intact Sensation intact distally Intact pulses distally Dorsiflexion/Plantar flexion intact Incision: scant drainage No cellulitis present Compartment soft} XR AP&Lat of hip shows well placed\fixed THA  Assessment:   1 Day Post-Op Procedure(s) (LRB): LEFT TOTAL HIP ARTHROPLASTY ANTERIOR APPROACH (Left) ADDITIONAL DIAGNOSIS:  Expected Acute Blood Loss Anemia, Hypertension, ETOH liver dz, CAD  Plan: PT/OT WBAT, THA  DVT Prophylaxis: SCDx72 hrs, ASA 81 mg BID x 2 weeks  DISCHARGE PLAN: Home, when passes PT  DISCHARGE NEEDS: HHPT, Walker and 3-in-1 comode seat

## 2017-12-07 NOTE — Evaluation (Signed)
Physical Therapy Evaluation Patient Details Name: Dylan Summers MRN: [id number] DOB: 01-19-1966 Today's Date: 12/07/2017   History of Present Illness  L Direct anterior THA  Clinical Impression  The patient  Ambulated x 300' with RW. Patient should progress to DC today after PT. Pt admitted with above diagnosis. Pt currently with functional limitations due to the deficits listed below (see PT Problem List).  Pt will benefit from skilled PT to increase their independence and safety with mobility to allow discharge to the venue listed below.       Follow Up Recommendations Home health PT;Follow surgeon's recommendation for DC plan and follow-up therapies    Equipment Recommendations  Rolling walker with 5" wheels;3in1 (PT)    Recommendations for Other Services       Precautions / Restrictions Precautions Precautions: Fall      Mobility  Bed Mobility Overal bed mobility: Needs Assistance Bed Mobility: Supine to Sit     Supine to sit: Supervision     General bed mobility comments: use of belt on the left foot to self assist  Transfers Overall transfer level: Needs assistance Equipment used: Rolling walker (2 wheeled) Transfers: Sit to/from Stand Sit to Stand: Supervision         General transfer comment: cues for hand and left leg position  Ambulation/Gait Ambulation/Gait assistance: Min guard Gait Distance (Feet): 300 Feet Assistive device: Rolling walker (2 wheeled) Gait Pattern/deviations: Step-through pattern     General Gait Details: cues for sequence  Stairs            Wheelchair Mobility    Modified Rankin (Stroke Patients Only)       Balance                                             Pertinent Vitals/Pain Pain Assessment: Faces Pain Location: left thigh Pain Descriptors / Indicators: Sore Pain Intervention(s): Limited activity within patient's tolerance;Monitored during session    Home Living Family/patient  expects to be discharged to:: Private residence Living Arrangements: Children Available Help at Discharge: Family Type of Home: House Home Access: Stairs to enter   Secretary/administrator of Steps: 1 Home Layout: One level Home Equipment: Environmental consultant - 2 wheels;Bedside commode;Cane - single point Additional Comments: 66 YO daughter is with patient    Prior Function Level of Independence: Independent with assistive device(s)               Hand Dominance        Extremity/Trunk Assessment   Upper Extremity Assessment Upper Extremity Assessment: Overall WFL for tasks assessed    Lower Extremity Assessment Lower Extremity Assessment: LLE deficits/detail LLE Deficits / Details: requires assist to flex the hip  greater than 90    Cervical / Trunk Assessment Cervical / Trunk Assessment: Normal  Communication   Communication: No difficulties  Cognition Arousal/Alertness: Awake/alert Behavior During Therapy: WFL for tasks assessed/performed Overall Cognitive Status: Within Functional Limits for tasks assessed                                        General Comments      Exercises Total Joint Exercises Ankle Circles/Pumps: AROM Hip ABduction/ADduction: AROM;Left;10 reps Long Arc Quad: AROM;10 reps;Left Marching in Standing: AAROM;Seated;10 reps;Left   Assessment/Plan  PT Assessment Patient needs continued PT services  PT Problem List Decreased strength;Decreased range of motion;Decreased activity tolerance;Decreased knowledge of use of DME;Decreased safety awareness;Decreased mobility;Decreased knowledge of precautions       PT Treatment Interventions DME instruction;Therapeutic exercise;Gait training;Stair training;Functional mobility training;Patient/family education;Therapeutic activities    PT Goals (Current goals can be found in the Care Plan section)  Acute Rehab PT Goals Patient Stated Goal: to walk without pain PT Goal Formulation: With  patient Time For Goal Achievement: 12/09/17 Potential to Achieve Goals: Good    Frequency 7X/week   Barriers to discharge        Co-evaluation               AM-PAC PT "6 Clicks" Daily Activity  Outcome Measure Difficulty turning over in bed (including adjusting bedclothes, sheets and blankets)?: A Little Difficulty moving from lying on back to sitting on the side of the bed? : A Little Difficulty sitting down on and standing up from a chair with arms (e.g., wheelchair, bedside commode, etc,.)?: A Little Help needed moving to and from a bed to chair (including a wheelchair)?: A Little Help needed walking in hospital room?: A Little Help needed climbing 3-5 steps with a railing? : A Little 6 Click Score: 18    End of Session   Activity Tolerance: Patient tolerated treatment well Patient left: in chair;with call bell/phone within reach;with chair alarm set Nurse Communication: Mobility status PT Visit Diagnosis: Unsteadiness on feet (R26.81)    Time: 1610-9604 PT Time Calculation (min) (ACUTE ONLY): 32 min   Charges:   PT Evaluation $PT Eval Low Complexity: 1 Low PT Treatments $Gait Training: 8-22 mins        [place] PT [phone]  Rada Hay 12/07/2017, 10:52 AM

## 2017-12-08 LAB — CBC
HCT: 19.2 % — ABNORMAL LOW (ref 39.0–52.0)
HEMOGLOBIN: 6.8 g/dL — AB (ref 13.0–17.0)
MCH: 34.9 pg — ABNORMAL HIGH (ref 26.0–34.0)
MCHC: 35.4 g/dL (ref 30.0–36.0)
MCV: 98.5 fL (ref 78.0–100.0)
PLATELETS: 70 10*3/uL — AB (ref 150–400)
RBC: 1.95 MIL/uL — AB (ref 4.22–5.81)
RDW: 13 % (ref 11.5–15.5)
WBC: 11 10*3/uL — ABNORMAL HIGH (ref 4.0–10.5)

## 2017-12-08 LAB — HEMOGLOBIN AND HEMATOCRIT, BLOOD
HCT: 24 % — ABNORMAL LOW (ref 39.0–52.0)
Hemoglobin: 8.5 g/dL — ABNORMAL LOW (ref 13.0–17.0)

## 2017-12-08 LAB — PREPARE RBC (CROSSMATCH)

## 2017-12-08 LAB — ABO/RH: ABO/RH(D): B POS

## 2017-12-08 MED ORDER — PANTOPRAZOLE SODIUM 40 MG PO TBEC: 40.0000 mg | DELAYED_RELEASE_TABLET | Freq: Two times a day (BID) | ORAL | Status: DC | PRN

## 2017-12-08 MED ORDER — PANTOPRAZOLE SODIUM 40 MG PO TBEC
40.0000 mg | DELAYED_RELEASE_TABLET | Freq: Every day | ORAL | Status: DC
  Administered 2017-12-08 – 2017-12-09 (×2): 40 mg via ORAL
  Filled 2017-12-08 (×2): qty 1

## 2017-12-08 MED ORDER — SODIUM CHLORIDE 0.9% IV SOLUTION: Freq: Once | INTRAVENOUS | Status: DC

## 2017-12-08 NOTE — Evaluation (Signed)
Occupational Therapy Evaluation Patient Details Name: Dylan Summers MRN: [id number] DOB: 1966/01/23 Today's Date: 12/08/2017    History of Present Illness L Direct anterior THA   Clinical Impression   This 52 year old man was admitted for the above sx. All education was completed. No further OT is needed at this time     Follow Up Recommendations  No OT follow up    Equipment Recommendations  3 in 1 bedside commode    Recommendations for Other Services       Precautions / Restrictions Precautions Precautions: Fall  Restrictions Weight Bearing Restrictions: No      Mobility Bed Mobility Overal bed mobility: Modified Independent             General bed mobility comments: oob  Transfers Overall transfer level: Needs assistance Equipment used: Rolling walker (2 wheeled) Transfers: Sit to/from Stand Sit to Stand: Supervision         General transfer comment: cues for hand and left leg position    Balance                                           ADL either performed or assessed with clinical judgement   ADL Overall ADL's : Needs assistance/impaired     Grooming: Supervision/safety;Standing       Lower Body Bathing: Minimal assistance;Sit to/from stand       Lower Body Dressing: Moderate assistance;Sit to/from stand   Toilet Transfer: Supervision/safety;Ambulation;RW(chair)       Tub/ Shower Transfer: Tub transfer;Min guard;Ambulation     General ADL Comments: ambulated to gym and practiced tub transfer. Educated on options for LB adls at home. Daughter can assist as needed. Recommended he have a friend nearby when he steps into tub. Practiced twice.  Educated on option of using 3:1 as shower seat; but that someone would have to move it in/out of tub as well as dry legs to avoid rusting.  Pt can perform UB adls with set up. Educated on keeping RW in front of him as he retrieves Licensed conveyancer      Pertinent Vitals/Pain Pain Assessment: Faces  Faces Pain Scale: Hurts a little bit Pain Location: left thigh Pain Descriptors / Indicators: Tight/stiff Pain Intervention(s): Limited activity within patient's tolerance;Monitored during session;Repositioned     Hand Dominance     Extremity/Trunk Assessment Upper Extremity Assessment Upper Extremity Assessment: Overall WFL for tasks assessed           Communication Communication Communication: No difficulties   Cognition Arousal/Alertness: Awake/alert Behavior During Therapy: WFL for tasks assessed/performed Overall Cognitive Status: Within Functional Limits for tasks assessed                                     General Comments       Exercises    Shoulder Instructions      Home Living Family/patient expects to be discharged to:: Private residence Living Arrangements: Children Available Help at Discharge: Family;Friend(s)               Bathroom Shower/Tub: Chief Strategy Officer: Standard     Home Equipment: Environmental consultant - 2 wheels;Bedside commode;Cane - single point(RW, 3:1  delivered to room)   Additional Comments: 30 YO daughter is with patient      Prior Functioning/Environment Level of Independence: Independent with assistive device(s)                 OT Problem List:        OT Treatment/Interventions:      OT Goals(Current goals can be found in the care plan section) Acute Rehab OT Goals Patient Stated Goal: to walk without pain OT Goal Formulation: All assessment and education complete, DC therapy  OT Frequency:     Barriers to D/C:            Co-evaluation              AM-PAC PT "6 Clicks" Daily Activity     Outcome Measure Help from another person eating meals?: None Help from another person taking care of personal grooming?: A Little Help from another person toileting, which includes using toliet, bedpan, or urinal?: A Little Help  from another person bathing (including washing, rinsing, drying)?: A Little Help from another person to put on and taking off regular upper body clothing?: A Little Help from another person to put on and taking off regular lower body clothing?: A Little 6 Click Score: 19   End of Session    Activity Tolerance: Patient tolerated treatment well Patient left: in chair;with call bell/phone within reach  OT Visit Diagnosis: Pain Pain - Right/Left: Left Pain - part of body: Hip                Time: 1455-1520 OT Time Calculation (min): 25 min Charges:  OT General Charges $OT Visit: 1 Visit OT Evaluation $OT Eval Low Complexity: 1 Low OT Treatments $Self Care/Home Management : 8-22 mins  Marica Otter, OTR/L [phone] 12/08/2017  Dylan Summers 12/08/2017, 3:40 PM

## 2017-12-08 NOTE — Progress Notes (Signed)
Physical Therapy Treatment Patient Details Name: Dylan Summers MRN: [id number] DOB: 1965/12/14 Today's Date: 12/08/2017    History of Present Illness L Direct anterior THA    PT Comments    The patient is progressing well.  Received 1 unit of blood. Plans Dc tomorrow.   Follow Up Recommendations  Home health PT;Follow surgeon's recommendation for DC plan and follow-up therapies     Equipment Recommendations  Rolling walker with 5" wheels;3in1 (PT)    Recommendations for Other Services       Precautions / Restrictions Precautions Precautions: Fall Precaution Comments: received 1 unit blood.    Mobility  Bed Mobility Overal bed mobility: Modified Independent             General bed mobility comments: used  right foot under left foot to self assist  Transfers Overall transfer level: Needs assistance Equipment used: Rolling walker (2 wheeled) Transfers: Sit to/from Stand Sit to Stand: Supervision         General transfer comment: cues for hand and left leg position  Ambulation/Gait Ambulation/Gait assistance: Supervision Gait Distance (Feet): 400 Feet Assistive device: Rolling walker (2 wheeled) Gait Pattern/deviations: Step-through pattern     General Gait Details: cues for posture   Stairs             Wheelchair Mobility    Modified Rankin (Stroke Patients Only)       Balance                                            Cognition Arousal/Alertness: Awake/alert                                            Exercises Total Joint Exercises Ankle Circles/Pumps: AROM;Left;10 reps;Supine Quad Sets: AROM;Both;10 reps;Supine Short Arc Quad: AROM;Left;10 reps;Supine Heel Slides: AAROM;Left;10 reps;Supine Hip ABduction/ADduction: AROM;Left;10 reps Long Arc Quad: AROM;10 reps;Left    General Comments        Pertinent Vitals/Pain Pain Score: 0-No pain    Home Living                       Prior Function            PT Goals (current goals can now be found in the care plan section) Progress towards PT goals: Progressing toward goals    Frequency    7X/week      PT Plan Current plan remains appropriate    Co-evaluation              AM-PAC PT "6 Clicks" Daily Activity  Outcome Measure  Difficulty turning over in bed (including adjusting bedclothes, sheets and blankets)?: A Little Difficulty moving from lying on back to sitting on the side of the bed? : A Little Difficulty sitting down on and standing up from a chair with arms (e.g., wheelchair, bedside commode, etc,.)?: A Little Help needed moving to and from a bed to chair (including a wheelchair)?: A Little Help needed walking in hospital room?: A Little Help needed climbing 3-5 steps with a railing? : A Little 6 Click Score: 18    End of Session   Activity Tolerance: Patient tolerated treatment well Patient left: in chair Nurse Communication: Mobility status PT Visit Diagnosis: Unsteadiness on  feet (R26.81)     Time: 3329-5188 PT Time Calculation (min) (ACUTE ONLY): 17 min  Charges:  $Gait Training: 8-22 mins                     [facility] PT [phone]     Rada Hay 12/08/2017, 3:07 PM

## 2017-12-08 NOTE — Progress Notes (Signed)
Dr. Turner Daniels notified of low Hgb of 6.8. Pt to receive one unit of blood today.

## 2017-12-08 NOTE — Progress Notes (Signed)
PATIENT ID: Dylan Summers  MRN: [id number]  DOB/AGE:  1965/07/24 / 52 y.o.  2 Days Post-Op Procedure(s) (LRB): LEFT TOTAL HIP ARTHROPLASTY ANTERIOR APPROACH (Left)    PROGRESS NOTE Subjective: Patient is alert, oriented, no Nausea, no Vomiting, yes passing gas, . Taking PO well. Denies SOB, Chest or Calf Pain. Using Incentive Spirometer, PAS in place. Ambulate 300'. Had low BP yesterday. Did well with 500cc bolus. Patient reports pain as  1/10  .    Objective: Vital signs in last 24 hours: Vitals:   12/07/17 1447 12/07/17 1738 12/07/17 2139 12/08/17 0146  BP: (Abnormal) 75/51 115/66 (Abnormal) 99/57 122/74  Pulse: 92 78 77 74  Resp: 16  16 18   Temp: 98.2 F (36.8 C)  98.1 F (36.7 C) (Abnormal) 97.4 F (36.3 C)  TempSrc: Oral  Oral Oral  SpO2: 100%  100% 100%  Weight:      Height:          Intake/Output from previous day: I/O last 3 completed shifts: In: 5670.1 [P.O.:1394; I.V.:3776.1; IV Piggyback:500] Out: 950 [Urine:750; Blood:200]   Intake/Output this shift: Total I/O In: 1200 [P.O.:1200] Out: 300 [Urine:300]   LABORATORY DATA: Recent Labs    12/07/17 0509 12/08/17 0536  WBC 9.8 11.0*  HGB 8.5* 6.8*  HCT 24.1* 19.2*  PLT 72* 70*  NA 131*  --   K 4.6  --   CL 101  --   CO2 22  --   BUN 23*  --   CREATININE 0.81  --   GLUCOSE 183*  --   CALCIUM 8.5*  --     Examination: Neurologically intact ABD soft Neurovascular intact Sensation intact distally Intact pulses distally Dorsiflexion/Plantar flexion intact Incision: scant drainage No cellulitis present Compartment soft} XR AP&Lat of hip shows well placed\fixed THA  Assessment:   2 Days Post-Op Procedure(s) (LRB): LEFT TOTAL HIP ARTHROPLASTY ANTERIOR APPROACH (Left) ADDITIONAL DIAGNOSIS:  Expected Acute Blood Loss Anemia, Hypertension , Hx ETOH abuse, STEMI Plan: PT/OT WBAT, THA, transfue 1 unit PRBC for sx anemia today.  DVT Prophylaxis: SCDx72 hrs, ASA 81 mg BID x 2 weeks  DISCHARGE  PLAN: Home  DISCHARGE NEEDS: HHPT, Walker and 3-in-1 comode seat

## 2017-12-09 LAB — TYPE AND SCREEN
ABO/RH(D): B POS
ANTIBODY SCREEN: NEGATIVE
Unit division: 0

## 2017-12-09 LAB — BPAM RBC
BLOOD PRODUCT EXPIRATION DATE: 201909062359
ISSUE DATE / TIME: 201908140832
Unit Type and Rh: 7300

## 2017-12-09 LAB — CBC
HEMATOCRIT: 22.7 % — AB (ref 39.0–52.0)
Hemoglobin: 7.9 g/dL — ABNORMAL LOW (ref 13.0–17.0)
MCH: 32.9 pg (ref 26.0–34.0)
MCHC: 34.8 g/dL (ref 30.0–36.0)
MCV: 94.6 fL (ref 78.0–100.0)
PLATELETS: 91 10*3/uL — AB (ref 150–400)
RBC: 2.4 MIL/uL — ABNORMAL LOW (ref 4.22–5.81)
RDW: 16.2 % — AB (ref 11.5–15.5)
WBC: 14.2 10*3/uL — AB (ref 4.0–10.5)

## 2017-12-09 NOTE — Discharge Instructions (Signed)

## 2017-12-09 NOTE — Progress Notes (Signed)
PATIENT ID: DARROW EGELER  MRN: [id number]  DOB/AGE:  1965-12-18 / 52 y.o.  3 Days Post-Op Procedure(s) (LRB): LEFT TOTAL HIP ARTHROPLASTY ANTERIOR APPROACH (Left)    PROGRESS NOTE Subjective: Patient is alert, oriented, no Nausea, no Vomiting, yes passing gas, . Taking PO well. Denies SOB, Chest or Calf Pain. Using Incentive Spirometer, PAS in place. Ambulate 300' Patient reports pain as  2/10  .    Objective: Vital signs in last 24 hours: Vitals:   12/08/17 1143 12/08/17 1433 12/08/17 2131 12/09/17 0544  BP: 102/68 101/61 96/62 (Abnormal) 92/54  Pulse: 81 89 84 92  Resp: 16 16 16 15   Temp: 98 F (36.7 C) 98.5 F (36.9 C) (Abnormal) 97.5 F (36.4 C) 98.4 F (36.9 C)  TempSrc: Oral Oral Oral Oral  SpO2: 100%  100% 100%  Weight:      Height:          Intake/Output from previous day: I/O last 3 completed shifts: In: 2664 [P.O.:1800; Blood:364; IV Piggyback:500] Out: 300 [Urine:300]   Intake/Output this shift: Total I/O In: 260 [P.O.:260] Out: -    LABORATORY DATA: Recent Labs    12/07/17 0509 12/08/17 0536 12/08/17 1939 12/09/17 0551  WBC 9.8 11.0*  --  14.2*  HGB 8.5* 6.8* 8.5* 7.9*  HCT 24.1* 19.2* 24.0* 22.7*  PLT 72* 70*  --  91*  NA 131*  --   --   --   K 4.6  --   --   --   CL 101  --   --   --   CO2 22  --   --   --   BUN 23*  --   --   --   CREATININE 0.81  --   --   --   GLUCOSE 183*  --   --   --   CALCIUM 8.5*  --   --   --     Examination: Neurologically intact ABD soft Neurovascular intact Sensation intact distally Intact pulses distally Dorsiflexion/Plantar flexion intact Incision: scant drainage No cellulitis present Compartment soft} XR AP&Lat of hip shows well placed\fixed THA  Assessment:   3 Days Post-Op Procedure(s) (LRB): LEFT TOTAL HIP ARTHROPLASTY ANTERIOR APPROACH (Left) ADDITIONAL DIAGNOSIS:  Expected Acute Blood Loss Anemia, Hypertension, hx STEMI, hx ETOH Hepatitis  Plan: PT/OT WBAT, THA  DVT Prophylaxis:  SCDx72 hrs, ASA 81 mg BID x 2 weeks  DISCHARGE PLAN: Home  DISCHARGE NEEDS: HHPT, Walker and 3-in-1 comode seat

## 2017-12-09 NOTE — Progress Notes (Signed)
30 Day Unplanned Readmission Risk Score     Admission (Current) from 12/06/2017 in [facility]  30 Day Unplanned Readmission Risk Score (%)  16 Filed at 12/09/2017 0800     This score is the patient's risk of an unplanned readmission within 30 days of being discharged (0 -100%). The score is based on dignosis, age, lab data, medications, orders, and past utilization.   Low:  0-14.9   Medium: 15-21.9   High: 22-29.9   Extreme: 30 and above        Readmission Risk Prevention Plan 12/09/2017  Transportation Screening Complete  PCP or Specialist Appt within 5-7 Days Complete  Home Care Screening Complete  Medication Review (RN CM) Complete  Some recent data might be hidden

## 2017-12-09 NOTE — Discharge Summary (Signed)
Patient ID: Dylan Summers MRN: [id number] DOB/AGE: June 20, 1965 52 y.o.  Admit date: 12/06/2017 Discharge date: 12/09/2017  Admission Diagnoses:  Principal Problem:   Osteoarthritis of left hip Active Problems:   Primary osteoarthritis of left hip   Discharge Diagnoses:  Same  Past Medical History:  Diagnosis Date  . Arthritis    "left groin & right foot pains only when weather gets cold" (05/31/2013)  . Chest pain    a. 05/2013 Ant STEMI called but cath showed nl cors and EF 55-60%, Trop neg, D-dimer elevated->CTA  . Coronary artery disease involving native coronary artery of native heart without angina pectoris 10/25/2017  . HTN (hypertension)   . Hyperlipidemia   . Steatosis, liver 06/02/2013    Surgeries: Procedure(s): LEFT TOTAL HIP ARTHROPLASTY ANTERIOR APPROACH on 12/06/2017   Consultants:   Discharged Condition: Improved  Hospital Course: GRYPHON VANDERVEEN is an 52 y.o. male who was admitted 12/06/2017 for operative treatment ofOsteoarthritis of left hip. Patient has severe unremitting pain that affects sleep, daily activities, and work/hobbies. After pre-op clearance the patient was taken to the operating room on 12/06/2017 and underwent  Procedure(s): LEFT TOTAL HIP ARTHROPLASTY ANTERIOR APPROACH.    Patient was given perioperative antibiotics:  Anti-infectives (From admission, onward)   Start     Dose/Rate Route Frequency Ordered Stop   12/06/17 1115  ceFAZolin (ANCEF) IVPB 2g/100 mL premix     2 g 200 mL/hr over 30 Minutes Intravenous  Once 12/06/17 1101 12/06/17 1346   12/06/17 1102  ceFAZolin (ANCEF) 2-4 GM/100ML-% IVPB    Note to Pharmacy:  Marney Doctor   : cabinet override      12/06/17 1102 12/06/17 1316       Patient was given sequential compression devices, early ambulation, and chemoprophylaxis to prevent DVT. Hgb was 6.8 POD #2, received 1 unit PRBC. Walked 300'  Patient benefited maximally from hospital stay and there were no complications.     Recent vital signs:  Patient Vitals for the past 24 hrs:  BP Temp Temp src Pulse Resp SpO2  12/09/17 0544 (Abnormal) 92/54 98.4 F (36.9 C) Oral 92 15 100 %  12/08/17 2131 96/62 (Abnormal) 97.5 F (36.4 C) Oral 84 16 100 %  12/08/17 1433 101/61 98.5 F (36.9 C) Oral 89 16 no documentation  12/08/17 1143 102/68 98 F (36.7 C) Oral 81 16 100 %  12/08/17 0856 (Abnormal) 104/59 97.8 F (36.6 C) Oral 87 16 100 %  12/08/17 0838 115/66 98 F (36.7 C) Oral 84 16 100 %     Recent laboratory studies:  Recent Labs    12/07/17 0509 12/08/17 0536 12/08/17 1939 12/09/17 0551  WBC 9.8 11.0*  --  14.2*  HGB 8.5* 6.8* 8.5* 7.9*  HCT 24.1* 19.2* 24.0* 22.7*  PLT 72* 70*  --  91*  NA 131*  --   --   --   K 4.6  --   --   --   CL 101  --   --   --   CO2 22  --   --   --   BUN 23*  --   --   --   CREATININE 0.81  --   --   --   GLUCOSE 183*  --   --   --   CALCIUM 8.5*  --   --   --      Discharge Medications:   Allergies as of 12/09/2017   No Known Allergies  Medication List    Take these medications   aspirin EC 81 MG tablet Take 1 tablet (81 mg total) by mouth daily.   carvedilol 12.5 MG tablet Commonly known as:  COREG Take 12.5 mg by mouth 2 (two) times daily with a meal.   Fish Oil 1000 MG Caps Take 1 capsule by mouth 3 (three) times daily.   lisinopril-hydrochlorothiazide 20-25 MG tablet Commonly known as:  PRINZIDE,ZESTORETIC Take 1 tablet by mouth daily.   oxyCODONE 5 MG immediate release tablet Commonly known as:  Oxy IR/ROXICODONE Take 1-2 tablets (5-10 mg total) by mouth every 4 (four) hours as needed for up to 7 days for severe pain.   tizanidine 2 MG capsule Commonly known as:  ZANAFLEX Take 2 capsules (4 mg total) by mouth 3 (three) times daily as needed for muscle spasms.        Durable Medical Equipment  (From admission, onward)         Start     Ordered   12/06/17 1805  DME Walker rolling  Once    Question:  Patient needs a walker to  treat with the following condition  Answer:  Status post total hip replacement, left   12/06/17 1804   12/06/17 1805  DME 3 n 1  Once     12/06/17 1804           Discharge Care Instructions  (From admission, onward)         Start     Ordered   12/09/17 0000  Change dressing    Comments:  You may change your dressing only if > 40% drainage on window   12/09/17 0647          Diagnostic Studies: Dg C-arm 1-60 Min-no Report  Result Date: 12/06/2017 Fluoroscopy was utilized by the requesting physician.  No radiographic interpretation.    Disposition: Discharge disposition: 01-Home or Self Care       Discharge Instructions    Call MD / Call 911   Complete by:  As directed    If you experience chest pain or shortness of breath, CALL 911 and be transported to the hospital emergency room.  If you develope a fever above 101 F, pus (white drainage) or increased drainage or redness at the wound, or calf pain, call your surgeon's office.   Change dressing   Complete by:  As directed    You may change your dressing only if > 40% drainage on window   Constipation Prevention   Complete by:  As directed    Drink plenty of fluids.  Prune juice may be helpful.  You may use a stool softener, such as Colace (over the counter) 100 mg twice a day.  Use MiraLax (over the counter) for constipation as needed.   Diet - low sodium heart healthy   Complete by:  As directed    Increase activity slowly as tolerated   Complete by:  As directed       Follow-up Information    [facility] Follow up.   Specialty:  Home Health Services Why:  physical therapy Contact information: [address] [place] [address] [phone]        Gean Birchwood, MD Follow up in 2 week(s).   Specialty:  Orthopedic Surgery Contact information: [address] [place] [address] [phone]            Signed: [name] 12/09/2017, 6:51 AM

## 2017-12-09 NOTE — Progress Notes (Signed)
Physical Therapy Treatment Patient Details Name: Dylan Summers MRN: [id number] DOB: 07-30-1965 Today's Date: 12/09/2017    History of Present Illness L Direct anterior THA    PT Comments    The  RN reports patient's BP is low. BP  87/55 pre then 112/58 manual post amb. Then sat down and BP 90/50. RN noted. Patient has no symptoms. Plans Dc today. To monitor BP at home.   Follow Up Recommendations  Home health PT;Follow surgeon's recommendation for DC plan and follow-up therapies     Equipment Recommendations  3in1 (PT);None recommended by PT    Recommendations for Other Services       Precautions / Restrictions Precautions Precaution Comments: has low BP Restrictions LLE Weight Bearing: Weight bearing as tolerated    Mobility  Bed Mobility Overal bed mobility: Modified Independent Bed Mobility: Supine to Sit;Sit to Supine              Transfers Overall transfer level: Needs assistance Equipment used: Rolling walker (2 wheeled) Transfers: Sit to/from Stand Sit to Stand: Modified independent (Device/Increase time)         General transfer comment: cues for hand and left leg position  Ambulation/Gait Ambulation/Gait assistance: Modified independent (Device/Increase time) Gait Distance (Feet): 400 Feet Assistive device: Rolling walker (2 wheeled) Gait Pattern/deviations: Step-through pattern         Stairs             Wheelchair Mobility    Modified Rankin (Stroke Patients Only)       Balance                                            Cognition Arousal/Alertness: Awake/alert Behavior During Therapy: WFL for tasks assessed/performed                                          Exercises Total Joint Exercises Ankle Circles/Pumps: AROM;Left;10 reps;Supine Quad Sets: AROM;Both;10 reps;Supine Short Arc Quad: AROM;Left;10 reps;Supine Heel Slides: AAROM;Left;10 reps;Supine Hip ABduction/ADduction:  AROM;Left;10 reps Long Arc Quad: AROM;10 reps;Left    General Comments        Pertinent Vitals/Pain Faces Pain Scale: Hurts a little bit Pain Location: left thigh Pain Descriptors / Indicators: Sore Pain Intervention(s): Monitored during session;Ice applied    Home Living                      Prior Function            PT Goals (current goals can now be found in the care plan section) Progress towards PT goals: Progressing toward goals    Frequency    7X/week      PT Plan Current plan remains appropriate    Co-evaluation              AM-PAC PT "6 Clicks" Daily Activity  Outcome Measure  Difficulty turning over in bed (including adjusting bedclothes, sheets and blankets)?: None Difficulty moving from lying on back to sitting on the side of the bed? : None Difficulty sitting down on and standing up from a chair with arms (e.g., wheelchair, bedside commode, etc,.)?: None Help needed moving to and from a bed to chair (including a wheelchair)?: None Help needed walking in hospital room?: None Help needed  climbing 3-5 steps with a railing? : A Little 6 Click Score: 23    End of Session   Activity Tolerance: Patient tolerated treatment well Patient left: in chair;with call bell/phone within reach;with chair alarm set Nurse Communication: Mobility status PT Visit Diagnosis: Unsteadiness on feet (R26.81)     Time: 1020-1105 PT Time Calculation (min) (ACUTE ONLY): 45 min  Charges:  $Gait Training: 8-22 mins $Therapeutic Exercise: 8-22 mins $Self Care/Home Management: 8-22                     [facility] PT [phone]    Rada Hay 12/09/2017, 1:14 PM

## 2018-09-12 ENCOUNTER — Other Ambulatory Visit: Payer: Self-pay

## 2018-09-12 ENCOUNTER — Encounter (HOSPITAL_COMMUNITY): Payer: Self-pay

## 2018-09-12 ENCOUNTER — Emergency Department (HOSPITAL_COMMUNITY)
Admission: EM | Admit: 2018-09-12 | Discharge: 2018-09-12 | Disposition: A | Discharge status: Home / Self Care | Payer: Managed Care, Other (non HMO) | Attending: Emergency Medicine | Admitting: Emergency Medicine

## 2018-09-12 DIAGNOSIS — Z79899 Other long term (current) drug therapy: Secondary | ICD-10-CM | POA: Insufficient documentation

## 2018-09-12 DIAGNOSIS — D696 Thrombocytopenia, unspecified: Secondary | ICD-10-CM | POA: Diagnosis not present

## 2018-09-12 DIAGNOSIS — R040 Epistaxis: Secondary | ICD-10-CM | POA: Insufficient documentation

## 2018-09-12 DIAGNOSIS — Z87891 Personal history of nicotine dependence: Secondary | ICD-10-CM | POA: Insufficient documentation

## 2018-09-12 DIAGNOSIS — I252 Old myocardial infarction: Secondary | ICD-10-CM | POA: Diagnosis not present

## 2018-09-12 DIAGNOSIS — I2510 Atherosclerotic heart disease of native coronary artery without angina pectoris: Secondary | ICD-10-CM | POA: Diagnosis not present

## 2018-09-12 DIAGNOSIS — Z7982 Long term (current) use of aspirin: Secondary | ICD-10-CM | POA: Diagnosis not present

## 2018-09-12 DIAGNOSIS — I10 Essential (primary) hypertension: Secondary | ICD-10-CM

## 2018-09-12 MED ORDER — PHENYLEPHRINE HCL 0.5 % NA SOLN
1.0000 [drp] | Freq: Once | NASAL | Status: AC
  Administered 2018-09-12: 1 [drp] via NASAL
  Filled 2018-09-12: qty 15

## 2018-09-12 MED ORDER — LISINOPRIL 10 MG PO TABS: 10.0000 mg | ORAL_TABLET | Freq: Every day | ORAL | 2 refills | Status: DC

## 2018-09-12 MED ORDER — CLONIDINE HCL 0.1 MG PO TABS
0.1000 mg | ORAL_TABLET | Freq: Once | ORAL | Status: AC
  Administered 2018-09-12: 18:00:00 0.1 mg via ORAL
  Filled 2018-09-12: qty 1

## 2018-09-12 NOTE — ED Triage Notes (Signed)
Bilateral nosebleed for about and hour and missed 2 doses of his blood pressure medication bleeding noted as oozing at this time.

## 2018-09-12 NOTE — ED Notes (Signed)
Given sprays of Afrin per EMS in transport and blood sugar 114.

## 2018-09-12 NOTE — Discharge Instructions (Addendum)
1.  Your nasal packing should stay in place for 2 days.  See your family doctor or return to the emergency department for removal. 2.  You need to take your blood pressure medication regularly. Your listed dose in your medical record is 20-25.  A prescription for the lisinopril portion of the medication has been ordered for an additional 10 mg daily.  Fill this and continue taking your 20-25 dose as well.  Will result in a total of lisinopril 30 mg daily and hydrochlorothiazide 25 mg daily.  Measure your blood pressures at home 2-3 times a day.  See your family doctor within the next 2 to 4 days for a recheck on your blood pressure and continued management of your blood pressure medications.

## 2018-09-12 NOTE — ED Provider Notes (Signed)
[facility]-EMERGENCY DEPT Provider Note   CSN: [id number] Arrival date & time: 09/12/18  1545    History   Chief Complaint Chief Complaint  Patient presents with  . Epistaxis    HPI Dylan Summers is a 53 y.o. male.     HPI Patient reports he blew his nose at about 1 PM and he just started gushing blood.  He reports it was bleeding from the right side.  He reports that it started bleeding from both sides.  He reports he even saw some blood, part of his right eye.  Patient reports that he is taking a daily aspirin.  He reports he did miss several doses of blood pressure medication over the weekend.  He took his lisinopril today.  He denies headache, confusion, lightheadedness or dizziness.  No Chest pain or shortness of breath.  Patient denies ever having had prior history of similar type of nosebleed.  He has never had to have nasal packing before. Past Medical History:  Diagnosis Date  . Arthritis    "left groin & right foot pains only when weather gets cold" (05/31/2013)  . Chest pain    a. 05/2013 Ant STEMI called but cath showed nl cors and EF 55-60%, Trop neg, D-dimer elevated->CTA  . Coronary artery disease involving native coronary artery of native heart without angina pectoris 10/25/2017  . HTN (hypertension)   . Hyperlipidemia   . Steatosis, liver 06/02/2013    Patient Active Problem List   Diagnosis Date Noted  . Primary osteoarthritis of left hip 12/06/2017  . Osteoarthritis of left hip 12/03/2017  . Coronary artery disease involving native coronary artery of native heart without angina pectoris 10/25/2017  . Benign essential hypertension 12/05/2015  . Exertional dyspnea 05/03/2015  . Alcoholic hepatitis 05/03/2015  . Hyponatremia 05/03/2015  . Macrocytic anemia 05/03/2015  . Thrombocytopenia (HCC) 05/03/2015  . Alcohol abuse 05/03/2015  . Dehydration 05/02/2015  . Hyperlipidemia   . Steatosis, liver 06/02/2013  . Tobacco abuse 06/01/2013   . Transaminitis 06/01/2013  . STEMI (ST elevation myocardial infarction) 05/31/13 05/31/2013  . HTN (hypertension) 05/31/2013  . Family history of coronary artery bypass surgery 05/31/2013  . Chest pain 05/31/2013    Past Surgical History:  Procedure Laterality Date  . CARDIAC CATHETERIZATION  05/31/2013   Normal but tortuous coronary arteries suggestive of hypertensive changes  . CLOSED REDUCTION SHOULDER DISLOCATION Left   . LEFT HEART CATHETERIZATION WITH CORONARY ANGIOGRAM N/A 05/31/2013   Procedure: LEFT HEART CATHETERIZATION WITH CORONARY ANGIOGRAM;  Surgeon: Iran Ouch, MD;  Location: [facility];  Service: Cardiovascular;  Laterality: N/A;  . TOTAL HIP ARTHROPLASTY Left 12/06/2017   Procedure: LEFT TOTAL HIP ARTHROPLASTY ANTERIOR APPROACH;  Surgeon: Gean Birchwood, MD;  Location: [facility];  Service: Orthopedics;  Laterality: Left;  Needs RNFA        Home Medications    Prior to Admission medications   Medication Sig Start Date End Date Taking? Authorizing Provider  aspirin EC 81 MG tablet Take 1 tablet (81 mg total) by mouth daily. 12/07/17 12/07/18  Gean Birchwood, MD  carvedilol (COREG) 12.5 MG tablet Take 12.5 mg by mouth 2 (two) times daily with a meal.    [provider]  lisinopril (ZESTRIL) 10 MG tablet Take 1 tablet (10 mg total) by mouth daily. 09/12/18   Arby Barrette, MD  lisinopril-hydrochlorothiazide (PRINZIDE,ZESTORETIC) 20-25 MG tablet Take 1 tablet by mouth daily. 10/30/15   Cathren Laine, MD  Omega-3 Fatty Acids (FISH  OIL) 1000 MG CAPS Take 1 capsule by mouth 3 (three) times daily.    [provider]  tizanidine (ZANAFLEX) 2 MG capsule Take 2 capsules (4 mg total) by mouth 3 (three) times daily as needed for muscle spasms. 12/07/17   Gean Birchwoodowan, Frank, MD    Family History Family History  Problem Relation Age of Onset  . Coronary artery disease Mother        CABG  . Heart disease Mother   . Heart attack Mother   . Other Father        UNKNOWN     Social History Social History   Tobacco Use  . Smoking status: Former Smoker    Packs/day: 0.50    Years: 15.00    Pack years: 7.50    Types: Cigars    Last attempt to quit: 04/28/2003    Years since quitting: 15.3  . Smokeless tobacco: Never Used  Substance Use Topics  . Alcohol use: Yes    Alcohol/week: 10.0 - 12.0 standard drinks    Types: 10 - 12 Shots of liquor per week    Comment: 4 days per week  . Drug use: No     Allergies   Patient has no known allergies.   Review of Systems Review of Systems 10 Systems reviewed and are negative for acute change except as noted in the HPI.   Physical Exam Updated Vital Signs BP (!) 149/98 (BP Location: Left Arm)   Pulse 92   Temp 98.1 F (36.7 C) (Oral)   Resp 15   Ht 5\' 7"  (1.702 m)   Wt 81.6 kg   SpO2 100%   BMI 28.19 kg/m   Physical Exam Constitutional:      Comments: Patient is alert and nontoxic.  Mental status clear.  No respiratory distress.  He does have active, brisk bleeding from the right nare that he is holding pressure on.  HENT:     Head: Normocephalic and atraumatic.     Nose:     Comments: Without pressure, continuous stream of blood from right nare obstructed by large clot.  Some blood visible in the mouth but no hanging clot visible in the posterior oropharynx.    Mouth/Throat:     Mouth: Mucous membranes are moist.  Eyes:     Extraocular Movements: Extraocular movements intact.  Pulmonary:     Effort: Pulmonary effort is normal.  Musculoskeletal: Normal range of motion.  Skin:    General: Skin is warm and dry.  Neurological:     General: No focal deficit present.     Mental Status: He is oriented to person, place, and time.     Coordination: Coordination normal.  Psychiatric:        Mood and Affect: Mood normal.      ED Treatments / Results  Labs (all labs ordered are listed, but only abnormal results are displayed) Labs Reviewed - No data to display  EKG None  Radiology No  results found.  Procedures .Epistaxis Management Date/Time: 09/12/2018 4:27 PM Performed by: Arby BarrettePfeiffer, Darrin Apodaca, MD Authorized by: Arby BarrettePfeiffer, Kari Kerth, MD   Consent:    Consent obtained:  Verbal   Consent given by:  Patient   Risks discussed:  Bleeding, infection, nasal injury and pain Anesthesia (see MAR for exact dosages):    Anesthesia method:  None Procedure details:    Treatment site:  R posterior   Treatment method:  Nasal balloon   Treatment complexity:  Extensive   Treatment episode:  initial   Post-procedure details:    Assessment:  Bleeding stopped   Patient tolerance of procedure:  Tolerated well, no immediate complications Comments:     Rapid Rhino nasal balloon placed without difficulty.  Inflated to 9 cc of air.  Patient tolerated this well.  There was no rebleeding.  There were several repeat observations and no posterior nasopharyngeal drip.   (including critical care time)  Medications Ordered in ED Medications  cloNIDine (CATAPRES) tablet 0.1 mg (has no administration in time range)  phenylephrine (NEO-SYNEPHRINE) 0.5 % nasal solution 1 drop (1 drop Each Nare Given 09/12/18 1625)     Initial Impression / Assessment and Plan / ED Course  I have reviewed the triage vital signs and the nursing notes.  Pertinent labs & imaging results that were available during my care of the patient were reviewed by me and considered in my medical decision making (see chart for details).       Patient has sudden onset of nosebleed when blowing his nose today.  It was bleeding briskly on arrival.  Rapid Rhino nasal balloon placed and leading immediately control.  Patient observed for over 30 minutes with no rebleeding.  Patient otherwise felt well.  He denied any lightheadedness, near syncope, pain or shortness of breath in association with the event.  Patient has been several doses of blood pressure medications and blood pressure is moderately elevated.  Patient is counseled on home  monitoring and increasing his lisinopril by another 10 mg if pressures remain elevated despite medication.  Return precautions reviewed.  Final Clinical Impressions(s) / ED Diagnoses   Final diagnoses:  Epistaxis  Essential hypertension    ED Discharge Orders         Ordered    lisinopril (ZESTRIL) 10 MG tablet  Daily     09/12/18 1747           Arby Barrette, MD 09/12/18 1749

## 2018-09-14 ENCOUNTER — Emergency Department (HOSPITAL_COMMUNITY)
Admission: EM | Admit: 2018-09-14 | Discharge: 2018-09-14 | Disposition: A | Discharge status: Home / Self Care | Payer: Managed Care, Other (non HMO) | Attending: Emergency Medicine | Admitting: Emergency Medicine

## 2018-09-14 ENCOUNTER — Other Ambulatory Visit: Payer: Self-pay

## 2018-09-14 ENCOUNTER — Encounter (HOSPITAL_COMMUNITY): Payer: Self-pay

## 2018-09-14 DIAGNOSIS — I10 Essential (primary) hypertension: Secondary | ICD-10-CM | POA: Insufficient documentation

## 2018-09-14 DIAGNOSIS — Z7982 Long term (current) use of aspirin: Secondary | ICD-10-CM | POA: Insufficient documentation

## 2018-09-14 DIAGNOSIS — Z4800 Encounter for change or removal of nonsurgical wound dressing: Secondary | ICD-10-CM | POA: Diagnosis not present

## 2018-09-14 DIAGNOSIS — Z87891 Personal history of nicotine dependence: Secondary | ICD-10-CM | POA: Diagnosis not present

## 2018-09-14 DIAGNOSIS — Z79899 Other long term (current) drug therapy: Secondary | ICD-10-CM | POA: Insufficient documentation

## 2018-09-14 DIAGNOSIS — I252 Old myocardial infarction: Secondary | ICD-10-CM | POA: Insufficient documentation

## 2018-09-14 DIAGNOSIS — I2510 Atherosclerotic heart disease of native coronary artery without angina pectoris: Secondary | ICD-10-CM | POA: Diagnosis not present

## 2018-09-14 NOTE — ED Notes (Signed)
Freida Busman, Md removed nasal packing.

## 2018-09-14 NOTE — ED Provider Notes (Signed)
[facility] Provider Note   CSN: [id number] Arrival date & time: 09/14/18  1247    History   Chief Complaint Chief Complaint  Patient presents with   nasal packing to be removed    HPI Dylan Summers is a 53 y.o. male.     53 year old male presents for nasal packing removal.  Had a right nasal packing performed several days ago and since that time he has been asymptomatic.  Denies any bleeding.  He is at his baseline     Past Medical History:  Diagnosis Date   Arthritis    "left groin & right foot pains only when weather gets cold" (05/31/2013)   Chest pain    a. 05/2013 Ant STEMI called but cath showed nl cors and EF 55-60%, Trop neg, D-dimer elevated->CTA   Coronary artery disease involving native coronary artery of native heart without angina pectoris 10/25/2017   HTN (hypertension)    Hyperlipidemia    Steatosis, liver 06/02/2013    Patient Active Problem List   Diagnosis Date Noted   Primary osteoarthritis of left hip 12/06/2017   Osteoarthritis of left hip 12/03/2017   Coronary artery disease involving native coronary artery of native heart without angina pectoris 10/25/2017   Benign essential hypertension 12/05/2015   Exertional dyspnea 05/03/2015   Alcoholic hepatitis 05/03/2015   Hyponatremia 05/03/2015   Macrocytic anemia 05/03/2015   Thrombocytopenia (HCC) 05/03/2015   Alcohol abuse 05/03/2015   Dehydration 05/02/2015   Hyperlipidemia    Steatosis, liver 06/02/2013   Tobacco abuse 06/01/2013   Transaminitis 06/01/2013   STEMI (ST elevation myocardial infarction) 05/31/13 05/31/2013   HTN (hypertension) 05/31/2013   Family history of coronary artery bypass surgery 05/31/2013   Chest pain 05/31/2013    Past Surgical History:  Procedure Laterality Date   CARDIAC CATHETERIZATION  05/31/2013   Normal but tortuous coronary arteries suggestive of hypertensive changes   CLOSED REDUCTION SHOULDER  DISLOCATION Left    LEFT HEART CATHETERIZATION WITH CORONARY ANGIOGRAM N/A 05/31/2013   Procedure: LEFT HEART CATHETERIZATION WITH CORONARY ANGIOGRAM;  Surgeon: Iran Ouch, MD;  Location: [facility];  Service: Cardiovascular;  Laterality: N/A;   TOTAL HIP ARTHROPLASTY Left 12/06/2017   Procedure: LEFT TOTAL HIP ARTHROPLASTY ANTERIOR APPROACH;  Surgeon: Gean Birchwood, MD;  Location: [facility];  Service: Orthopedics;  Laterality: Left;  Needs RNFA        Home Medications    Prior to Admission medications   Medication Sig Start Date End Date Taking? Authorizing Provider  aspirin EC 81 MG tablet Take 1 tablet (81 mg total) by mouth daily. 12/07/17 12/07/18  Gean Birchwood, MD  carvedilol (COREG) 12.5 MG tablet Take 12.5 mg by mouth 2 (two) times daily with a meal.    [provider]  lisinopril (ZESTRIL) 10 MG tablet Take 1 tablet (10 mg total) by mouth daily. 09/12/18   Arby Barrette, MD  lisinopril-hydrochlorothiazide (PRINZIDE,ZESTORETIC) 20-25 MG tablet Take 1 tablet by mouth daily. 10/30/15   Cathren Laine, MD  Omega-3 Fatty Acids (FISH OIL) 1000 MG CAPS Take 1 capsule by mouth 3 (three) times daily.    [provider]  tizanidine (ZANAFLEX) 2 MG capsule Take 2 capsules (4 mg total) by mouth 3 (three) times daily as needed for muscle spasms. 12/07/17   Gean Birchwood, MD    Family History Family History  Problem Relation Age of Onset   Coronary artery disease Mother        CABG  Heart disease Mother    Heart attack Mother    Other Father        UNKNOWN    Social History Social History   Tobacco Use   Smoking status: Former Smoker    Packs/day: 0.50    Years: 15.00    Pack years: 7.50    Types: Cigars    Last attempt to quit: 04/28/2003    Years since quitting: 15.3   Smokeless tobacco: Never Used  Substance Use Topics   Alcohol use: Yes    Alcohol/week: 10.0 - 12.0 standard drinks    Types: 10 - 12 Shots of liquor per week    Comment: 4 days per  week   Drug use: No     Allergies   Patient has no known allergies.   Review of Systems Review of Systems  All other systems reviewed and are negative.    Physical Exam Updated Vital Signs BP (!) 182/115 (BP Location: Left Arm)    Pulse 81    Temp 98.1 F (36.7 C) (Oral)    Resp 16    Ht 1.702 m (5\' 7" )    Wt 81.6 kg    SpO2 100%    BMI 28.18 kg/m   Physical Exam Vitals signs and nursing note reviewed.  Constitutional:      Appearance: He is well-developed. He is not toxic-appearing.  HENT:     Head: Normocephalic and atraumatic.     Nose:     Right Nostril: Foreign body present.  Eyes:     Conjunctiva/sclera: Conjunctivae normal.     Pupils: Pupils are equal, round, and reactive to light.  Neck:     Musculoskeletal: Normal range of motion.  Cardiovascular:     Rate and Rhythm: Normal rate.  Pulmonary:     Effort: Pulmonary effort is normal.  Skin:    General: Skin is warm and dry.  Neurological:     Mental Status: He is alert and oriented to person, place, and time.      ED Treatments / Results  Labs (all labs ordered are listed, but only abnormal results are displayed) Labs Reviewed - No data to display  EKG None  Radiology No results found.  Procedures Procedures (including critical care time)  Medications Ordered in ED Medications - No data to display   Initial Impression / Assessment and Plan / ED Course  I have reviewed the triage vital signs and the nursing notes.  Pertinent labs & imaging results that were available during my care of the patient were reviewed by me and considered in my medical decision making (see chart for details).        Nasal packing removed and no active bleeding at this time.  Stable for discharge and return precautions given  Final Clinical Impressions(s) / ED Diagnoses   Final diagnoses:  None    ED Discharge Orders    None       Lorre Nick, MD 09/14/18 1304

## 2018-09-14 NOTE — ED Triage Notes (Signed)
Patient had epistaxis 2 days ago and was told to come back today to have packing removed from the right nare.

## 2018-09-14 NOTE — Discharge Instructions (Addendum)
Return here for any bleeding from your nose that is not controlled with direct pressure for 10 minutes with an ice pack

## 2018-09-26 ENCOUNTER — Telehealth: Payer: Self-pay | Admitting: *Deleted

## 2018-09-26 NOTE — Telephone Encounter (Signed)
A message was left, re: follow up visit. 

## 2018-10-03 NOTE — Telephone Encounter (Signed)
Second message left,re: follow up visit. 

## 2018-10-10 ENCOUNTER — Telehealth: Payer: Self-pay | Admitting: *Deleted

## 2018-10-10 NOTE — Telephone Encounter (Signed)
A message was left, re: follow up visit. 

## 2018-11-07 ENCOUNTER — Telehealth: Payer: Self-pay | Admitting: *Deleted

## 2018-11-07 NOTE — Telephone Encounter (Signed)
Left message for patient to call and schedule 12 month follow up appointment with Dr.Arida

## 2019-06-02 ENCOUNTER — Encounter: Payer: Self-pay | Admitting: General Practice

## 2019-11-25 ENCOUNTER — Emergency Department (HOSPITAL_COMMUNITY): Payer: Managed Care, Other (non HMO)

## 2019-11-25 ENCOUNTER — Inpatient Hospital Stay (HOSPITAL_COMMUNITY)
Admission: EM | Admit: 2019-11-25 | Discharge: 2019-11-27 | DRG: 897 | Disposition: A | Discharge status: Home / Self Care | Payer: Managed Care, Other (non HMO) | Attending: Internal Medicine | Admitting: Internal Medicine

## 2019-11-25 ENCOUNTER — Encounter (HOSPITAL_COMMUNITY): Payer: Self-pay | Admitting: Emergency Medicine

## 2019-11-25 DIAGNOSIS — E872 Acidosis, unspecified: Secondary | ICD-10-CM | POA: Diagnosis present

## 2019-11-25 DIAGNOSIS — Z79899 Other long term (current) drug therapy: Secondary | ICD-10-CM

## 2019-11-25 DIAGNOSIS — I10 Essential (primary) hypertension: Secondary | ICD-10-CM | POA: Diagnosis present

## 2019-11-25 DIAGNOSIS — K7010 Alcoholic hepatitis without ascites: Secondary | ICD-10-CM | POA: Diagnosis present

## 2019-11-25 DIAGNOSIS — K709 Alcoholic liver disease, unspecified: Secondary | ICD-10-CM | POA: Diagnosis present

## 2019-11-25 DIAGNOSIS — I5032 Chronic diastolic (congestive) heart failure: Secondary | ICD-10-CM | POA: Diagnosis present

## 2019-11-25 DIAGNOSIS — D62 Acute posthemorrhagic anemia: Secondary | ICD-10-CM | POA: Diagnosis present

## 2019-11-25 DIAGNOSIS — F10229 Alcohol dependence with intoxication, unspecified: Principal | ICD-10-CM | POA: Diagnosis present

## 2019-11-25 DIAGNOSIS — Y908 Blood alcohol level of 240 mg/100 ml or more: Secondary | ICD-10-CM | POA: Diagnosis present

## 2019-11-25 DIAGNOSIS — I2510 Atherosclerotic heart disease of native coronary artery without angina pectoris: Secondary | ICD-10-CM | POA: Diagnosis present

## 2019-11-25 DIAGNOSIS — Y92410 Unspecified street and highway as the place of occurrence of the external cause: Secondary | ICD-10-CM

## 2019-11-25 DIAGNOSIS — Z20822 Contact with and (suspected) exposure to covid-19: Secondary | ICD-10-CM | POA: Diagnosis present

## 2019-11-25 DIAGNOSIS — D649 Anemia, unspecified: Secondary | ICD-10-CM | POA: Diagnosis present

## 2019-11-25 DIAGNOSIS — K2920 Alcoholic gastritis without bleeding: Secondary | ICD-10-CM | POA: Diagnosis present

## 2019-11-25 DIAGNOSIS — E538 Deficiency of other specified B group vitamins: Secondary | ICD-10-CM

## 2019-11-25 DIAGNOSIS — F10129 Alcohol abuse with intoxication, unspecified: Secondary | ICD-10-CM | POA: Diagnosis present

## 2019-11-25 DIAGNOSIS — Z8249 Family history of ischemic heart disease and other diseases of the circulatory system: Secondary | ICD-10-CM

## 2019-11-25 DIAGNOSIS — E785 Hyperlipidemia, unspecified: Secondary | ICD-10-CM | POA: Diagnosis present

## 2019-11-25 DIAGNOSIS — K7460 Unspecified cirrhosis of liver: Secondary | ICD-10-CM | POA: Diagnosis not present

## 2019-11-25 DIAGNOSIS — Z87891 Personal history of nicotine dependence: Secondary | ICD-10-CM

## 2019-11-25 DIAGNOSIS — M1990 Unspecified osteoarthritis, unspecified site: Secondary | ICD-10-CM | POA: Diagnosis present

## 2019-11-25 DIAGNOSIS — D696 Thrombocytopenia, unspecified: Secondary | ICD-10-CM | POA: Diagnosis present

## 2019-11-25 DIAGNOSIS — D539 Nutritional anemia, unspecified: Secondary | ICD-10-CM | POA: Diagnosis present

## 2019-11-25 DIAGNOSIS — R000 Tachycardia, unspecified: Secondary | ICD-10-CM | POA: Diagnosis present

## 2019-11-25 DIAGNOSIS — I110 Hypertensive heart disease with heart failure: Secondary | ICD-10-CM | POA: Diagnosis present

## 2019-11-25 DIAGNOSIS — Z96642 Presence of left artificial hip joint: Secondary | ICD-10-CM | POA: Diagnosis present

## 2019-11-25 DIAGNOSIS — D6959 Other secondary thrombocytopenia: Secondary | ICD-10-CM | POA: Diagnosis present

## 2019-11-25 LAB — CBC
HCT: 20.9 % — ABNORMAL LOW (ref 39.0–52.0)
Hemoglobin: 6.7 g/dL — CL (ref 13.0–17.0)
MCH: 35.8 pg — ABNORMAL HIGH (ref 26.0–34.0)
MCHC: 32.1 g/dL (ref 30.0–36.0)
MCV: 111.8 fL — ABNORMAL HIGH (ref 80.0–100.0)
Platelets: 81 10*3/uL — ABNORMAL LOW (ref 150–400)
RBC: 1.87 MIL/uL — ABNORMAL LOW (ref 4.22–5.81)
RDW: 16.5 % — ABNORMAL HIGH (ref 11.5–15.5)
WBC: 6.7 10*3/uL (ref 4.0–10.5)
nRBC: 1.3 % — ABNORMAL HIGH (ref 0.0–0.2)

## 2019-11-25 LAB — COMPREHENSIVE METABOLIC PANEL
ALT: 61 U/L — ABNORMAL HIGH (ref 0–44)
AST: 71 U/L — ABNORMAL HIGH (ref 15–41)
Albumin: 3.2 g/dL — ABNORMAL LOW (ref 3.5–5.0)
Alkaline Phosphatase: 181 U/L — ABNORMAL HIGH (ref 38–126)
Anion gap: 12 (ref 5–15)
BUN: 22 mg/dL — ABNORMAL HIGH (ref 6–20)
CO2: 16 mmol/L — ABNORMAL LOW (ref 22–32)
Calcium: 8.5 mg/dL — ABNORMAL LOW (ref 8.9–10.3)
Chloride: 110 mmol/L (ref 98–111)
Creatinine, Ser: 1 mg/dL (ref 0.61–1.24)
GFR calc Af Amer: >60 mL/min (ref >60)
GFR calc non Af Amer: >60 mL/min (ref >60)
Glucose, Bld: 105 mg/dL — ABNORMAL HIGH (ref 70–99)
Potassium: 3.7 mmol/L (ref 3.5–5.1)
Sodium: 138 mmol/L (ref 135–145)
Total Bilirubin: 1.2 mg/dL (ref 0.3–1.2)
Total Protein: 6.6 g/dL (ref 6.5–8.1)

## 2019-11-25 LAB — ETHANOL: Alcohol, Ethyl (B): 378 mg/dL (ref <10)

## 2019-11-25 LAB — LACTIC ACID, PLASMA: Lactic Acid, Venous: 3.5 mmol/L (ref 0.5–1.9)

## 2019-11-25 MED ORDER — SODIUM CHLORIDE 0.9% IV SOLUTION: Freq: Once | INTRAVENOUS | Status: AC

## 2019-11-25 MED ORDER — IOHEXOL 300 MG/ML  SOLN
100.0000 mL | Freq: Once | INTRAMUSCULAR | Status: AC | PRN
  Administered 2019-11-25: 100 mL via INTRAVENOUS

## 2019-11-25 MED ORDER — KETOROLAC TROMETHAMINE 30 MG/ML IJ SOLN: 30.0000 mg | Freq: Once | INTRAMUSCULAR | Status: DC
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
## 2019-11-26 ENCOUNTER — Encounter (HOSPITAL_COMMUNITY): Payer: Self-pay | Admitting: Internal Medicine

## 2019-11-26 ENCOUNTER — Observation Stay (HOSPITAL_COMMUNITY): Payer: Managed Care, Other (non HMO)

## 2019-11-26 DIAGNOSIS — E872 Acidosis, unspecified: Secondary | ICD-10-CM | POA: Diagnosis present

## 2019-11-26 DIAGNOSIS — D62 Acute posthemorrhagic anemia: Secondary | ICD-10-CM

## 2019-11-26 DIAGNOSIS — I2510 Atherosclerotic heart disease of native coronary artery without angina pectoris: Secondary | ICD-10-CM | POA: Diagnosis present

## 2019-11-26 DIAGNOSIS — Z8249 Family history of ischemic heart disease and other diseases of the circulatory system: Secondary | ICD-10-CM | POA: Diagnosis not present

## 2019-11-26 DIAGNOSIS — Z79899 Other long term (current) drug therapy: Secondary | ICD-10-CM | POA: Diagnosis not present

## 2019-11-26 DIAGNOSIS — K2920 Alcoholic gastritis without bleeding: Secondary | ICD-10-CM | POA: Diagnosis present

## 2019-11-26 DIAGNOSIS — I110 Hypertensive heart disease with heart failure: Secondary | ICD-10-CM | POA: Diagnosis present

## 2019-11-26 DIAGNOSIS — F10129 Alcohol abuse with intoxication, unspecified: Secondary | ICD-10-CM | POA: Diagnosis not present

## 2019-11-26 DIAGNOSIS — K709 Alcoholic liver disease, unspecified: Secondary | ICD-10-CM | POA: Diagnosis present

## 2019-11-26 DIAGNOSIS — D696 Thrombocytopenia, unspecified: Secondary | ICD-10-CM

## 2019-11-26 DIAGNOSIS — I5032 Chronic diastolic (congestive) heart failure: Secondary | ICD-10-CM

## 2019-11-26 DIAGNOSIS — E538 Deficiency of other specified B group vitamins: Secondary | ICD-10-CM | POA: Diagnosis present

## 2019-11-26 DIAGNOSIS — K7460 Unspecified cirrhosis of liver: Secondary | ICD-10-CM

## 2019-11-26 DIAGNOSIS — Y908 Blood alcohol level of 240 mg/100 ml or more: Secondary | ICD-10-CM | POA: Diagnosis present

## 2019-11-26 DIAGNOSIS — M1990 Unspecified osteoarthritis, unspecified site: Secondary | ICD-10-CM | POA: Diagnosis present

## 2019-11-26 DIAGNOSIS — Z87891 Personal history of nicotine dependence: Secondary | ICD-10-CM | POA: Diagnosis not present

## 2019-11-26 DIAGNOSIS — D6959 Other secondary thrombocytopenia: Secondary | ICD-10-CM | POA: Diagnosis present

## 2019-11-26 DIAGNOSIS — I10 Essential (primary) hypertension: Secondary | ICD-10-CM | POA: Diagnosis not present

## 2019-11-26 DIAGNOSIS — D539 Nutritional anemia, unspecified: Secondary | ICD-10-CM | POA: Diagnosis present

## 2019-11-26 DIAGNOSIS — Z96642 Presence of left artificial hip joint: Secondary | ICD-10-CM | POA: Diagnosis present

## 2019-11-26 DIAGNOSIS — D649 Anemia, unspecified: Secondary | ICD-10-CM | POA: Diagnosis present

## 2019-11-26 DIAGNOSIS — K7010 Alcoholic hepatitis without ascites: Secondary | ICD-10-CM | POA: Diagnosis present

## 2019-11-26 DIAGNOSIS — K7030 Alcoholic cirrhosis of liver without ascites: Secondary | ICD-10-CM

## 2019-11-26 DIAGNOSIS — R000 Tachycardia, unspecified: Secondary | ICD-10-CM | POA: Diagnosis present

## 2019-11-26 DIAGNOSIS — Z20822 Contact with and (suspected) exposure to covid-19: Secondary | ICD-10-CM | POA: Diagnosis present

## 2019-11-26 DIAGNOSIS — E785 Hyperlipidemia, unspecified: Secondary | ICD-10-CM | POA: Diagnosis present

## 2019-11-26 DIAGNOSIS — F10229 Alcohol dependence with intoxication, unspecified: Secondary | ICD-10-CM | POA: Diagnosis present

## 2019-11-26 DIAGNOSIS — Y92410 Unspecified street and highway as the place of occurrence of the external cause: Secondary | ICD-10-CM | POA: Diagnosis not present

## 2019-11-26 LAB — URINALYSIS, ROUTINE W REFLEX MICROSCOPIC
Bacteria, UA: NONE SEEN
Bilirubin Urine: NEGATIVE
Glucose, UA: NEGATIVE mg/dL
Ketones, ur: NEGATIVE mg/dL
Leukocytes,Ua: NEGATIVE
Nitrite: NEGATIVE
Protein, ur: NEGATIVE mg/dL
Specific Gravity, Urine: 1.018 (ref 1.005–1.030)
pH: 6 (ref 5.0–8.0)

## 2019-11-26 LAB — CBC
HCT: 22.3 % — ABNORMAL LOW (ref 39.0–52.0)
HCT: 22.8 % — ABNORMAL LOW (ref 39.0–52.0)
Hemoglobin: 7.4 g/dL — ABNORMAL LOW (ref 13.0–17.0)
Hemoglobin: 7.3 g/dL — ABNORMAL LOW (ref 13.0–17.0)
MCH: 35.7 pg — ABNORMAL HIGH (ref 26.0–34.0)
MCH: 34.4 pg — ABNORMAL HIGH (ref 26.0–34.0)
MCHC: 33.2 g/dL (ref 30.0–36.0)
MCHC: 32 g/dL (ref 30.0–36.0)
MCV: 107.7 fL — ABNORMAL HIGH (ref 80.0–100.0)
MCV: 107.5 fL — ABNORMAL HIGH (ref 80.0–100.0)
Platelets: 71 10*3/uL — ABNORMAL LOW (ref 150–400)
Platelets: 68 10*3/uL — ABNORMAL LOW (ref 150–400)
RBC: 2.07 MIL/uL — ABNORMAL LOW (ref 4.22–5.81)
RBC: 2.12 MIL/uL — ABNORMAL LOW (ref 4.22–5.81)
RDW: 16.8 % — ABNORMAL HIGH (ref 11.5–15.5)
RDW: 18.8 % — ABNORMAL HIGH (ref 11.5–15.5)
WBC: 6.2 10*3/uL (ref 4.0–10.5)
WBC: 6.1 10*3/uL (ref 4.0–10.5)
nRBC: 1 % — ABNORMAL HIGH (ref 0.0–0.2)
nRBC: 0.8 % — ABNORMAL HIGH (ref 0.0–0.2)

## 2019-11-26 LAB — IRON AND TIBC
Iron: 49 ug/dL (ref 45–182)
Saturation Ratios: 14 % — ABNORMAL LOW (ref 17.9–39.5)
TIBC: 349 ug/dL (ref 250–450)
UIBC: 300 ug/dL

## 2019-11-26 LAB — HIV ANTIBODY (ROUTINE TESTING W REFLEX): HIV Screen 4th Generation wRfx: NONREACTIVE

## 2019-11-26 LAB — LACTIC ACID, PLASMA
Lactic Acid, Venous: 3.3 mmol/L (ref 0.5–1.9)
Lactic Acid, Venous: 3.5 mmol/L (ref 0.5–1.9)

## 2019-11-26 LAB — APTT: aPTT: 31 seconds (ref 24–36)

## 2019-11-26 LAB — RETICULOCYTES
Immature Retic Fract: 42.5 % — ABNORMAL HIGH (ref 2.3–15.9)
RBC.: 1.82 MIL/uL — ABNORMAL LOW (ref 4.22–5.81)
Retic Count, Absolute: 93.5 10*3/uL (ref 19.0–186.0)
Retic Ct Pct: 5.1 % — ABNORMAL HIGH (ref 0.4–3.1)

## 2019-11-26 LAB — PROTIME-INR
INR: 1.6 — ABNORMAL HIGH (ref 0.8–1.2)
Prothrombin Time: 18.3 seconds — ABNORMAL HIGH (ref 11.4–15.2)

## 2019-11-26 LAB — FOLATE: Folate: 2.9 ng/mL — ABNORMAL LOW (ref >5.9)

## 2019-11-26 LAB — VITAMIN B12: Vitamin B-12: 433 pg/mL (ref 180–914)

## 2019-11-26 LAB — PREPARE RBC (CROSSMATCH)

## 2019-11-26 LAB — SARS CORONAVIRUS 2 BY RT PCR (HOSPITAL ORDER, PERFORMED IN ~~LOC~~ HOSPITAL LAB): SARS Coronavirus 2: NEGATIVE

## 2019-11-26 LAB — FERRITIN: Ferritin: 504 ng/mL — ABNORMAL HIGH (ref 24–336)

## 2019-11-26 MED ORDER — PANTOPRAZOLE SODIUM 40 MG IV SOLR
40.0000 mg | Freq: Two times a day (BID) | INTRAVENOUS | Status: DC
  Administered 2019-11-26: 40 mg via INTRAVENOUS
  Filled 2019-11-26 (×2): qty 40

## 2019-11-26 MED ORDER — LORAZEPAM 2 MG/ML IJ SOLN: 1.0000 mg | INTRAMUSCULAR | Status: DC | PRN

## 2019-11-26 MED ORDER — ACETAMINOPHEN 325 MG PO TABS: 650.0000 mg | ORAL_TABLET | Freq: Four times a day (QID) | ORAL | Status: DC | PRN

## 2019-11-26 MED ORDER — PANTOPRAZOLE SODIUM 40 MG PO TBEC
40.0000 mg | DELAYED_RELEASE_TABLET | Freq: Every day | ORAL | Status: DC
  Administered 2019-11-27: 40 mg via ORAL
  Filled 2019-11-26: qty 1

## 2019-11-26 MED ORDER — ADULT MULTIVITAMIN W/MINERALS CH
1.0000 | ORAL_TABLET | Freq: Every day | ORAL | Status: DC
  Administered 2019-11-26 – 2019-11-27 (×2): 1 via ORAL
  Filled 2019-11-26 (×2): qty 1

## 2019-11-26 MED ORDER — LORAZEPAM 1 MG PO TABS: 1.0000 mg | ORAL_TABLET | ORAL | Status: DC | PRN

## 2019-11-26 MED ORDER — HYDRALAZINE HCL 20 MG/ML IJ SOLN: 10.0000 mg | Freq: Four times a day (QID) | INTRAMUSCULAR | Status: DC | PRN

## 2019-11-26 MED ORDER — LACTATED RINGERS IV SOLN: INTRAVENOUS | Status: AC

## 2019-11-26 MED ORDER — FOLIC ACID 1 MG PO TABS
1.0000 mg | ORAL_TABLET | Freq: Every day | ORAL | Status: DC
  Administered 2019-11-26 – 2019-11-27 (×2): 1 mg via ORAL
  Filled 2019-11-26 (×2): qty 1

## 2019-11-26 MED ORDER — ONDANSETRON HCL 4 MG/2ML IJ SOLN: 4.0000 mg | Freq: Four times a day (QID) | INTRAMUSCULAR | Status: DC | PRN

## 2019-11-26 MED ORDER — THIAMINE HCL 100 MG PO TABS
100.0000 mg | ORAL_TABLET | Freq: Every day | ORAL | Status: DC
  Administered 2019-11-26 – 2019-11-27 (×2): 100 mg via ORAL
  Filled 2019-11-26 (×2): qty 1

## 2019-11-26 MED ORDER — SODIUM CHLORIDE 0.9 % IV SOLN
80.0000 mg | Freq: Once | INTRAVENOUS | Status: AC
  Administered 2019-11-26: 02:00:00 80 mg via INTRAVENOUS
  Filled 2019-11-26: qty 80

## 2019-11-26 MED ORDER — ACETAMINOPHEN 650 MG RE SUPP: 650.0000 mg | Freq: Four times a day (QID) | RECTAL | Status: DC | PRN

## 2019-11-26 MED ORDER — THIAMINE HCL 100 MG/ML IJ SOLN: 100.0000 mg | Freq: Every day | INTRAMUSCULAR | Status: DC

## 2019-11-26 MED ORDER — ONDANSETRON HCL 4 MG PO TABS: 4.0000 mg | ORAL_TABLET | Freq: Four times a day (QID) | ORAL | Status: DC | PRN

## 2019-11-26 MED ORDER — PANTOPRAZOLE SODIUM 40 MG IV SOLR: 40.0000 mg | Freq: Once | INTRAVENOUS | Status: DC

## 2019-11-26 NOTE — Progress Notes (Signed)
Attempted to call nurse for report, she was unavailable
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
## 2019-11-26 NOTE — ED Notes (Signed)
Attempted to call report, nurse unavailable.

## 2019-11-26 NOTE — Plan of Care (Signed)
Initiation of Care Plan Problem: Education: Goal: Knowledge of General Education information will improve Description: Including pain rating scale, medication(s)/side effects and non-pharmacologic comfort measures Outcome: Progressing   Problem: Health Behavior/Discharge Planning: Goal: Ability to manage health-related needs will improve Outcome: Progressing   Problem: Clinical Measurements: Goal: Ability to maintain clinical measurements within normal limits will improve Outcome: Progressing Goal: Will remain free from infection Outcome: Progressing Goal: Diagnostic test results will improve Outcome: Progressing Goal: Respiratory complications will improve Outcome: Progressing Goal: Cardiovascular complication will be avoided Outcome: Progressing   Problem: Activity: Goal: Risk for activity intolerance will decrease Outcome: Progressing   Problem: Nutrition: Goal: Adequate nutrition will be maintained Outcome: Progressing   Problem: Coping: Goal: Level of anxiety will decrease Outcome: Progressing   Problem: Elimination: Goal: Will not experience complications related to bowel motility Outcome: Progressing Goal: Will not experience complications related to urinary retention Outcome: Progressing   Problem: Pain Managment: Goal: General experience of comfort will improve Outcome: Progressing   Problem: Safety: Goal: Ability to remain free from injury will improve Outcome: Progressing   Problem: Skin Integrity: Goal: Risk for impaired skin integrity will decrease Outcome: Progressing   Problem: Education: Goal: Knowledge of disease or condition will improve Outcome: Progressing Goal: Understanding of discharge needs will improve Outcome: Progressing   Problem: Health Behavior/Discharge Planning: Goal: Ability to identify changes in lifestyle to reduce recurrence of condition will improve Outcome: Progressing Goal: Identification of resources available to  assist in meeting health care needs will improve Outcome: Progressing   Problem: Physical Regulation: Goal: Complications related to the disease process, condition or treatment will be avoided or minimized Outcome: Progressing   Problem: Safety: Goal: Ability to remain free from injury will improve Outcome: Progressing
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
## 2019-11-26 NOTE — Plan of Care (Signed)

## 2019-11-27 LAB — CBC WITH DIFFERENTIAL/PLATELET
Abs Immature Granulocytes: 0.02 10*3/uL (ref 0.00–0.07)
Basophils Absolute: 0 10*3/uL (ref 0.0–0.1)
Basophils Relative: 1 %
Eosinophils Absolute: 0 10*3/uL (ref 0.0–0.5)
Eosinophils Relative: 1 %
HCT: 23.5 % — ABNORMAL LOW (ref 39.0–52.0)
Hemoglobin: 7.9 g/dL — ABNORMAL LOW (ref 13.0–17.0)
Immature Granulocytes: 0 %
Lymphocytes Relative: 34 %
Lymphs Abs: 2.1 10*3/uL (ref 0.7–4.0)
MCH: 36.1 pg — ABNORMAL HIGH (ref 26.0–34.0)
MCHC: 33.6 g/dL (ref 30.0–36.0)
MCV: 107.3 fL — ABNORMAL HIGH (ref 80.0–100.0)
Monocytes Absolute: 0.6 10*3/uL (ref 0.1–1.0)
Monocytes Relative: 10 %
Neutro Abs: 3.3 10*3/uL (ref 1.7–7.7)
Neutrophils Relative %: 54 %
Platelets: 63 10*3/uL — ABNORMAL LOW (ref 150–400)
RBC: 2.19 MIL/uL — ABNORMAL LOW (ref 4.22–5.81)
RDW: 18.8 % — ABNORMAL HIGH (ref 11.5–15.5)
WBC: 6 10*3/uL (ref 4.0–10.5)
nRBC: 1.7 % — ABNORMAL HIGH (ref 0.0–0.2)

## 2019-11-27 LAB — TYPE AND SCREEN
ABO/RH(D): B POS
Antibody Screen: NEGATIVE
Unit division: 0

## 2019-11-27 LAB — COMPREHENSIVE METABOLIC PANEL
ALT: 47 U/L — ABNORMAL HIGH (ref 0–44)
AST: 60 U/L — ABNORMAL HIGH (ref 15–41)
Albumin: 2.8 g/dL — ABNORMAL LOW (ref 3.5–5.0)
Alkaline Phosphatase: 128 U/L — ABNORMAL HIGH (ref 38–126)
Anion gap: 6 (ref 5–15)
BUN: 13 mg/dL (ref 6–20)
CO2: 20 mmol/L — ABNORMAL LOW (ref 22–32)
Calcium: 8.1 mg/dL — ABNORMAL LOW (ref 8.9–10.3)
Chloride: 107 mmol/L (ref 98–111)
Creatinine, Ser: 0.77 mg/dL (ref 0.61–1.24)
GFR calc Af Amer: >60 mL/min (ref >60)
GFR calc non Af Amer: >60 mL/min (ref >60)
Glucose, Bld: 99 mg/dL (ref 70–99)
Potassium: 3.4 mmol/L — ABNORMAL LOW (ref 3.5–5.1)
Sodium: 133 mmol/L — ABNORMAL LOW (ref 135–145)
Total Bilirubin: 1.7 mg/dL — ABNORMAL HIGH (ref 0.3–1.2)
Total Protein: 5.2 g/dL — ABNORMAL LOW (ref 6.5–8.1)

## 2019-11-27 LAB — BPAM RBC
Blood Product Expiration Date: 202108072359
ISSUE DATE / TIME: 202108010228
Unit Type and Rh: 1700

## 2019-11-27 LAB — MAGNESIUM: Magnesium: 1.1 mg/dL — ABNORMAL LOW (ref 1.7–2.4)

## 2019-11-27 LAB — LACTIC ACID, PLASMA: Lactic Acid, Venous: 1.4 mmol/L (ref 0.5–1.9)

## 2019-11-27 MED ORDER — POTASSIUM CHLORIDE ER 20 MEQ PO TBCR: 20.0000 meq | EXTENDED_RELEASE_TABLET | Freq: Every day | ORAL | 0 refills | Status: AC

## 2019-11-27 MED ORDER — PANTOPRAZOLE SODIUM 40 MG PO TBEC: 40.0000 mg | DELAYED_RELEASE_TABLET | Freq: Every day | ORAL | 1 refills | Status: AC

## 2019-11-27 MED ORDER — MAGNESIUM OXIDE 400 MG PO CAPS: 400.0000 mg | ORAL_CAPSULE | Freq: Every day | ORAL | 1 refills | Status: AC

## 2019-11-27 MED ORDER — FOLIC ACID 1 MG PO TABS: 1.0000 mg | ORAL_TABLET | Freq: Every day | ORAL | 2 refills | Status: AC

## 2019-11-27 MED ORDER — POTASSIUM CHLORIDE CRYS ER 20 MEQ PO TBCR
40.0000 meq | EXTENDED_RELEASE_TABLET | Freq: Once | ORAL | Status: AC
  Administered 2019-11-27: 40 meq via ORAL
  Filled 2019-11-27: qty 2

## 2019-11-27 MED ORDER — THIAMINE HCL 100 MG PO TABS: 100.0000 mg | ORAL_TABLET | Freq: Every day | ORAL | 1 refills | Status: AC

## 2019-11-27 MED ORDER — CARVEDILOL 6.25 MG PO TABS: 6.2500 mg | ORAL_TABLET | Freq: Two times a day (BID) | ORAL | 1 refills | Status: AC

## 2019-11-27 MED ORDER — MAGNESIUM SULFATE 4 GM/100ML IV SOLN
4.0000 g | Freq: Once | INTRAVENOUS | Status: AC
  Administered 2019-11-27: 4 g via INTRAVENOUS
  Filled 2019-11-27: qty 100

## 2019-11-27 MED ORDER — LISINOPRIL 20 MG PO TABS: 20.0000 mg | ORAL_TABLET | Freq: Every day | ORAL | 1 refills | Status: AC

## 2019-11-27 NOTE — Progress Notes (Signed)
Patient completing IV magnesium and then going to be discharged.  Given instructions and stated understanding.

## 2019-11-27 NOTE — Progress Notes (Signed)
Patient left when his ride got here [phone].

## 2019-11-27 NOTE — Plan of Care (Signed)
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
## 2019-11-27 NOTE — Discharge Instructions (Signed)
Cirrhosis  Cirrhosis is long-term (chronic) liver injury. The liver is the body's largest internal organ, and it performs many functions. It converts food into energy, removes toxic material from the blood, makes important proteins, and absorbs necessary vitamins from food. In cirrhosis, healthy liver cells are replaced by scar tissue. This prevents blood from flowing through the liver, making it difficult for the liver to function. Scarring of the liver cannot be reversed, but treatment can prevent it from getting worse. What are the causes? Common causes of this condition are hepatitis C and long-term alcohol abuse. Other causes include:  Nonalcoholic fatty liver disease. This happens when fat is deposited in the liver by causes other than alcohol.  Hepatitis B infection.  Autoimmune hepatitis. In this condition, the body's defense system (immune system) mistakenly attacks the liver cells, causing irritation and swelling (inflammation).  Diseases that cause blockage of ducts inside the liver.  Inherited liver diseases, such as hemochromatosis. This is one of the most common inherited liver diseases. In this disease, deposits of iron collect in the liver and other organs.  Reactions to certain long-term medicines, such as amiodarone, a heart medicine.  Parasitic infections. These include schistosomiasis, which is caused by a flatworm.  Long-term contact to certain toxins. These toxins include certain organic solvents, such as toluene and chloroform. What increases the risk? You are more likely to develop this condition if:  You have certain types of viral hepatitis.  You abuse alcohol, especially if you are male.  You are overweight.  You share needles.  You have unprotected sex with someone who has viral hepatitis. What are the signs or symptoms? You may not have any signs and symptoms at first. Symptoms may not develop until the damage to your liver starts to get worse. Early  symptoms may include:  Weakness and tiredness (fatigue).  Changes in sleep patterns or having trouble sleeping.  Itchiness.  Tenderness in the right-upper part of your abdomen.  Weight loss and muscle loss.  Nausea.  Loss of appetite.  Appearance of tiny blood vessels under the skin. Later symptoms may include:  Fatigue or weakness that is getting worse.  Yellow skin and eyes (jaundice).  Buildup of fluid in the abdomen (ascites). You may notice that your clothes are tight around your waist.  Weight gain.  Swelling of the feet and ankles (edema).  Trouble breathing.  Easy bruising and bleeding.  Vomiting blood.  Black or bloody stool.  Mental confusion. How is this diagnosed? Your health care provider may suspect cirrhosis based on your symptoms and medical history, especially if you have other medical conditions or a history of alcohol abuse. Your health care provider will do a physical exam to feel your liver and to check for signs of cirrhosis. He or she may perform other tests, including:  Blood tests to check: ? For hepatitis B or C. ? Kidney function. ? Liver function.  Imaging tests such as: ? MRI or CT scan to look for changes seen in advanced cirrhosis. ? Ultrasound to see if normal liver tissue is being replaced by scar tissue.  A procedure in which a long needle is used to take a sample of liver tissue to be checked in a lab (biopsy). Liver biopsy can confirm the diagnosis of cirrhosis. How is this treated? Treatment for this condition depends on how damaged your liver is and what caused the damage. It may include treating the symptoms of cirrhosis, or treating the underlying causes in order to   slow the damage. Treatment may include:  Making lifestyle changes, such as: ? Eating a healthy diet. You may need to work with your health care provider or a diet and nutrition specialist (dietitian) to develop an eating plan. ? Restricting salt  intake. ? Maintaining a healthy weight. ? Not abusing drugs or alcohol.  Taking medicines to: ? Treat liver infections or other infections. ? Control itching. ? Reduce fluid buildup. ? Reduce certain blood toxins. ? Reduce risk of bleeding from enlarged blood vessels in the stomach or esophagus (varices).  Liver transplant. In this procedure, a liver from a donor is used to replace your diseased liver. This is done if cirrhosis has caused liver failure. Other treatments and procedures may be done depending on the problems that you get from cirrhosis. Common problems include liver-related kidney failure (hepatorenal syndrome). Follow these instructions at home:   Take medicines only as told by your health care provider. Do not use medicines that are toxic to your liver. Ask your health care provider before taking any new medicines, including over-the-counter medicines.  Rest as needed.  Eat a well-balanced diet. Ask your health care provider or dietitian for more information.  Limit your salt or water intake, if your health care provider asks you to do this.  Do not drink alcohol. This is especially important if you are taking acetaminophen.  Keep all follow-up visits as told by your health care provider. This is important. Contact a health care provider if you:  Have fatigue or weakness that is getting worse.  Develop swelling of the hands, feet, legs, or face.  Have a fever.  Develop loss of appetite.  Have nausea or vomiting.  Develop jaundice.  Develop easy bruising or bleeding. Get help right away if you:  Vomit bright red blood or a material that looks like coffee grounds.  Have blood in your stools.  Notice that your stools appear black and tarry.  Become confused.  Have chest pain or trouble breathing. Summary  Cirrhosis is chronic liver injury. Liver damage cannot be reversed. Common causes are hepatitis C and long-term alcohol abuse.  Tests used to  diagnose cirrhosis include blood tests, imaging tests, and liver biopsy.  Treatment for this condition involves treating the underlying cause. Avoid alcohol, drugs, salt, and medicines that may damage your liver.  Contact your health care provider if you develop ascites, edema, jaundice, fever, nausea or vomiting, easy bruising or bleeding, or worsening fatigue. This information is not intended to replace advice given to you by your health care provider. Make sure you discuss any questions you have with your health care provider. Document Revised: 08/03/2018 Document Reviewed: 03/03/2017 Elsevier Patient Education  [place].   Anemia  Anemia is a condition in which you do not have enough red blood cells or hemoglobin. Hemoglobin is a substance in red blood cells that carries oxygen. When you do not have enough red blood cells or hemoglobin (are anemic), your body cannot get enough oxygen and your organs may not work properly. As a result, you may feel very tired or have other problems. What are the causes? Common causes of anemia include:  Excessive bleeding. Anemia can be caused by excessive bleeding inside or outside the body, including bleeding from the intestine or from periods in women.  Poor nutrition.  Long-lasting (chronic) kidney, thyroid, and liver disease.  Bone marrow disorders.  Cancer and treatments for cancer.  HIV (human immunodeficiency virus) and AIDS (acquired immunodeficiency syndrome).  Treatments  for HIV and AIDS.  Spleen problems.  Blood disorders.  Infections, medicines, and autoimmune disorders that destroy red blood cells. What are the signs or symptoms? Symptoms of this condition include:  Minor weakness.  Dizziness.  Headache.  Feeling heartbeats that are irregular or faster than normal (palpitations).  Shortness of breath, especially with exercise.  Paleness.  Cold sensitivity.  Indigestion.  Nausea.  Difficulty  sleeping.  Difficulty concentrating. Symptoms may occur suddenly or develop slowly. If your anemia is mild, you may not have symptoms. How is this diagnosed? This condition is diagnosed based on:  Blood tests.  Your medical history.  A physical exam.  Bone marrow biopsy. Your health care provider may also check your stool (feces) for blood and may do additional testing to look for the cause of your bleeding. You may also have other tests, including:  Imaging tests, such as a CT scan or MRI.  Endoscopy.  Colonoscopy. How is this treated? Treatment for this condition depends on the cause. If you continue to lose a lot of blood, you may need to be treated at a hospital. Treatment may include:  Taking supplements of iron, vitamin G89, or folic acid.  Taking a hormone medicine (erythropoietin) that can help to stimulate red blood cell growth.  Having a blood transfusion. This may be needed if you lose a lot of blood.  Making changes to your diet.  Having surgery to remove your spleen. Follow these instructions at home:  Take over-the-counter and prescription medicines only as told by your health care provider.  Take supplements only as told by your health care provider.  Follow any diet instructions that you were given.  Keep all follow-up visits as told by your health care provider. This is important. Contact a health care provider if:  You develop new bleeding anywhere in the body. Get help right away if:  You are very weak.  You are short of breath.  You have pain in your abdomen or chest.  You are dizzy or feel faint.  You have trouble concentrating.  You have bloody or black, tarry stools.  You vomit repeatedly or you vomit up blood. Summary  Anemia is a condition in which you do not have enough red blood cells or enough of a substance in your red blood cells that carries oxygen (hemoglobin).  Symptoms may occur suddenly or develop slowly.  If your  anemia is mild, you may not have symptoms.  This condition is diagnosed with blood tests as well as a medical history and physical exam. Other tests may be needed.  Treatment for this condition depends on the cause of the anemia. This information is not intended to replace advice given to you by your health care provider. Make sure you discuss any questions you have with your health care provider. Document Revised: 03/26/2017 Document Reviewed: 05/15/2016 Elsevier Patient Education  [place].

## 2019-11-27 NOTE — Plan of Care (Signed)
Problem: Education: Goal: Knowledge of General Education information will improve Description: Including pain rating scale, medication(s)/side effects and non-pharmacologic comfort measures 11/27/2019 1218 by Coy Saunas, RN Outcome: Adequate for Discharge 11/27/2019 1204 by Coy Saunas, RN Outcome: Progressing   Problem: Health Behavior/Discharge Planning: Goal: Ability to manage health-related needs will improve 11/27/2019 1218 by Coy Saunas, RN Outcome: Adequate for Discharge 11/27/2019 1204 by Coy Saunas, RN Outcome: Progressing   Problem: Clinical Measurements: Goal: Ability to maintain clinical measurements within normal limits will improve 11/27/2019 1218 by Coy Saunas, RN Outcome: Adequate for Discharge 11/27/2019 1204 by Coy Saunas, RN Outcome: Progressing Goal: Will remain free from infection 11/27/2019 1218 by Coy Saunas, RN Outcome: Adequate for Discharge 11/27/2019 1204 by Coy Saunas, RN Outcome: Progressing Goal: Diagnostic test results will improve 11/27/2019 1218 by Coy Saunas, RN Outcome: Adequate for Discharge 11/27/2019 1204 by Coy Saunas, RN Outcome: Progressing Goal: Respiratory complications will improve 11/27/2019 1218 by Coy Saunas, RN Outcome: Adequate for Discharge 11/27/2019 1204 by Coy Saunas, RN Outcome: Progressing Goal: Cardiovascular complication will be avoided 11/27/2019 1218 by Coy Saunas, RN Outcome: Adequate for Discharge 11/27/2019 1204 by Coy Saunas, RN Outcome: Progressing   Problem: Activity: Goal: Risk for activity intolerance will decrease 11/27/2019 1218 by Coy Saunas, RN Outcome: Adequate for Discharge 11/27/2019 1204 by Coy Saunas, RN Outcome: Progressing   Problem: Nutrition: Goal: Adequate nutrition will be maintained 11/27/2019 1218 by Coy Saunas, RN Outcome: Adequate for Discharge 11/27/2019 1204 by Coy Saunas, RN Outcome: Progressing   Problem: Coping: Goal: Level of anxiety  will decrease 11/27/2019 1218 by Coy Saunas, RN Outcome: Adequate for Discharge 11/27/2019 1204 by Coy Saunas, RN Outcome: Progressing   Problem: Elimination: Goal: Will not experience complications related to bowel motility 11/27/2019 1218 by Coy Saunas, RN Outcome: Adequate for Discharge 11/27/2019 1204 by Coy Saunas, RN Outcome: Progressing Goal: Will not experience complications related to urinary retention 11/27/2019 1218 by Coy Saunas, RN Outcome: Adequate for Discharge 11/27/2019 1204 by Coy Saunas, RN Outcome: Progressing   Problem: Pain Managment: Goal: General experience of comfort will improve 11/27/2019 1218 by Coy Saunas, RN Outcome: Adequate for Discharge 11/27/2019 1204 by Coy Saunas, RN Outcome: Progressing   Problem: Safety: Goal: Ability to remain free from injury will improve 11/27/2019 1218 by Coy Saunas, RN Outcome: Adequate for Discharge 11/27/2019 1204 by Coy Saunas, RN Outcome: Progressing   Problem: Skin Integrity: Goal: Risk for impaired skin integrity will decrease 11/27/2019 1218 by Coy Saunas, RN Outcome: Adequate for Discharge 11/27/2019 1204 by Coy Saunas, RN Outcome: Progressing   Problem: Education: Goal: Knowledge of disease or condition will improve 11/27/2019 1218 by Coy Saunas, RN Outcome: Adequate for Discharge 11/27/2019 1204 by Coy Saunas, RN Outcome: Progressing Goal: Understanding of discharge needs will improve 11/27/2019 1218 by Coy Saunas, RN Outcome: Adequate for Discharge 11/27/2019 1204 by Coy Saunas, RN Outcome: Progressing   Problem: Health Behavior/Discharge Planning: Goal: Ability to identify changes in lifestyle to reduce recurrence of condition will improve 11/27/2019 1218 by Coy Saunas, RN Outcome: Adequate for Discharge 11/27/2019 1204 by Coy Saunas, RN Outcome: Progressing Goal: Identification of resources available to assist in meeting health care needs will  improve 11/27/2019 1218 by Coy Saunas, RN Outcome: Adequate for Discharge 11/27/2019 1204 by Coy Saunas, RN Outcome: Progressing  Problem: Physical Regulation: Goal: Complications related to the disease process, condition or treatment will be avoided or minimized 11/27/2019 1218 by Coy Saunas, RN Outcome: Adequate for Discharge 11/27/2019 1204 by Coy Saunas, RN Outcome: Progressing   Problem: Safety: Goal: Ability to remain free from injury will improve 11/27/2019 1218 by Coy Saunas, RN Outcome: Adequate for Discharge 11/27/2019 1204 by Coy Saunas, RN Outcome: Progressing

## 2019-11-27 NOTE — TOC CAGE-AID Note (Signed)
Transition of Care (TOC) - CAGE-AID Screening   Patient Details  Name: Dylan Summers MRN: [id number] Date of Birth: 12/23/1965  Transition of Care (TOC) CM/SW Contact:    Miranda  Gainey, LCSWA Phone Number: 11/27/2019, 12:35 PM   Clinical Narrative:  CSW met with pt at bedside. CSW introduced self and explained her role at the hospital.  Pt states he has about two drinks of alcohol a day, most days a week. Pts blood alcohol level was .379, pt stated he did not drink more that usual the days leading up to his hospital admission. Pt denied substance use.   CSW discussed serving sizes with pt and pt demonstrated what he considers a drink to be. According to educational hand out CSW informed pt that he may be drinking a lot more than he realizes. Pt states that he doesn't have an interest in stopping or cutting back at this time. Pt denied resources but was accepting of the educational handout.   CAGE-AID Screening:    Have You Ever Felt You Ought to Cut Down on Your Drinking or Drug Use?: No Have People Annoyed You By Critizing Your Drinking Or Drug Use?: No Have You Felt Bad Or Guilty About Your Drinking Or Drug Use?: No Have You Ever Had a Drink or Used Drugs First Thing In The Morning to Steady Your Nerves or to Get Rid of a Hangover?: No CAGE-AID Score: 0  Substance Abuse Education Offered: Yes    Miranda Gainey, LCSWA, LCASA Clinical Social Worker [phone]        
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
## 2019-11-28 ENCOUNTER — Encounter (HOSPITAL_COMMUNITY): Payer: Self-pay | Admitting: Surgery

## 2019-11-28 LAB — AFP TUMOR MARKER: AFP, Serum, Tumor Marker: 7.7 ng/mL (ref 0.0–8.3)

## 2020-08-19 ENCOUNTER — Ambulatory Visit (HOSPITAL_COMMUNITY): Payer: Self-pay | Admitting: Behavioral Health

## 2020-08-29 ENCOUNTER — Ambulatory Visit (HOSPITAL_COMMUNITY): Payer: 59 | Admitting: Behavioral Health

## 2020-09-25 DEATH — deceased
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
